# Patient Record
Sex: Female | Born: 1990 | Race: Black or African American | Hispanic: No | Marital: Single | Smoking: Former smoker
Health system: Southern US, Community
[De-identification: ages and names within clinical notes are randomized; demographics above are authoritative.]

## PROBLEM LIST (undated history)

## (undated) ENCOUNTER — Inpatient Hospital Stay (HOSPITAL_COMMUNITY): Payer: Self-pay

## (undated) DIAGNOSIS — E669 Obesity, unspecified: Secondary | ICD-10-CM

## (undated) DIAGNOSIS — T7840XA Allergy, unspecified, initial encounter: Secondary | ICD-10-CM

## (undated) DIAGNOSIS — I10 Essential (primary) hypertension: Secondary | ICD-10-CM

## (undated) DIAGNOSIS — D649 Anemia, unspecified: Secondary | ICD-10-CM

## (undated) HISTORY — DX: Obesity, unspecified: E66.9

## (undated) HISTORY — PX: INDUCED ABORTION: SHX677

## (undated) HISTORY — PX: WISDOM TOOTH EXTRACTION: SHX21

## (undated) HISTORY — DX: Allergy, unspecified, initial encounter: T78.40XA

## (undated) HISTORY — DX: Anemia, unspecified: D64.9

---

## 2011-11-06 ENCOUNTER — Encounter (HOSPITAL_COMMUNITY): Payer: Self-pay | Admitting: *Deleted

## 2011-11-06 ENCOUNTER — Emergency Department (HOSPITAL_COMMUNITY)
Admission: EM | Admit: 2011-11-06 | Discharge: 2011-11-06 | Disposition: A | Discharge status: Home / Self Care | Payer: BC Managed Care – PPO | Attending: Emergency Medicine | Admitting: Emergency Medicine

## 2011-11-06 DIAGNOSIS — R209 Unspecified disturbances of skin sensation: Secondary | ICD-10-CM | POA: Insufficient documentation

## 2011-11-06 DIAGNOSIS — E860 Dehydration: Secondary | ICD-10-CM | POA: Insufficient documentation

## 2011-11-06 DIAGNOSIS — M79609 Pain in unspecified limb: Secondary | ICD-10-CM | POA: Insufficient documentation

## 2011-11-06 LAB — URINALYSIS, ROUTINE W REFLEX MICROSCOPIC
Bilirubin Urine: NEGATIVE
Hgb urine dipstick: NEGATIVE
Specific Gravity, Urine: 1.009 (ref 1.005–1.030)
pH: 6.5 (ref 5.0–8.0)

## 2011-11-06 LAB — BASIC METABOLIC PANEL
CO2: 24 mEq/L (ref 19–32)
Chloride: 99 mEq/L (ref 96–112)
GFR calc non Af Amer: >90 mL/min (ref >90)
Glucose, Bld: 112 mg/dL — ABNORMAL HIGH (ref 70–99)
Potassium: 3.5 mEq/L (ref 3.5–5.1)
Sodium: 135 mEq/L (ref 135–145)

## 2011-11-06 MED ORDER — SODIUM CHLORIDE 0.9 % IV BOLUS (SEPSIS)
2000.0000 mL | Freq: Once | INTRAVENOUS | Status: AC
  Administered 2011-11-06: 2000 mL via INTRAVENOUS

## 2011-11-06 MED ORDER — KETOROLAC TROMETHAMINE 30 MG/ML IJ SOLN: 30.0000 mg | Freq: Once | INTRAMUSCULAR | Status: DC

## 2011-11-06 NOTE — ED Notes (Signed)
Pt. Ate 100% of dinner.  Pt. Is feeling better

## 2011-11-06 NOTE — ED Notes (Signed)
Spoke with Iv nurse she will be down to insert heplock

## 2011-11-06 NOTE — ED Notes (Signed)
Pt woke up Saturday with face and leg tightness.  Pt states she drank a lot of water and then states her heart or breathing felt weird.  Her hands and legs have been tingling and reports hands twitching

## 2011-11-06 NOTE — ED Provider Notes (Signed)
Medical screening examination/treatment/procedure(s) were performed by non-physician practitioner and as supervising physician I was immediately available for consultation/collaboration.  Stephen Turnbaugh, MD 11/06/11 1712 

## 2011-11-06 NOTE — ED Notes (Signed)
Family at bedside. 

## 2011-11-06 NOTE — ED Notes (Signed)
Pt states that she went to a party on Friday and woke up Saturday morning and had numbness and tingling in arms and hands. sts also numbness and tightness in face. sts she did have a few drinks. Pt anxious and hypertensive. No hx of same.

## 2011-11-06 NOTE — ED Provider Notes (Signed)
History     CSN: [id number]  Arrival date & time 11/06/11  1029   First MD Initiated Contact with Patient 11/06/11 1115      No chief complaint on file.   (Consider location/radiation/quality/duration/timing/severity/associated sxs/prior treatment) HPI Patient presents to the emergency department with facial tightness and extremity tingling.  Patient, states she was out drinking liquor on Saturday night, and she woke up Sunday morning, with tingling in her extremities and facial tightness.  Patient denies chest pain, shortness of breath, nausea, vomiting, weakness, numbness, headache, blurred vision, dysuria, abdominal pain, or fever.  Patient said she did not take anything prior to arrival, for her symptoms History reviewed. No pertinent past medical history.  Past Surgical History  Procedure Date  . Wisdom tooth extraction     No family history on file.  History  Substance Use Topics  . Smoking status: Current Every Day Smoker  . Smokeless tobacco: Not on file  . Alcohol Use: Yes     occ    OB History    Grav Para Term Preterm Abortions TAB SAB Ect Mult Living                  Review of Systems All other systems negative except as documented in the HPI. All pertinent positives and negatives as reviewed in the HPI.  Allergies  Penicillins  Home Medications  No current outpatient prescriptions on file.  BP 149/84  Pulse 92  Temp 98.9 F (37.2 C) (Oral)  Resp 18  SpO2 100%  LMP 10/10/2011  Physical Exam  Nursing note and vitals reviewed. Constitutional: She is oriented to person, place, and time. She appears well-developed and well-nourished. No distress.  HENT:  Head: Normocephalic and atraumatic.  Mouth/Throat: Oropharynx is clear and moist.  Eyes: Pupils are equal, round, and reactive to light.  Cardiovascular: Normal rate, regular rhythm and normal heart sounds.   Pulmonary/Chest: Effort normal and breath sounds normal.  Neurological: She is alert  and oriented to person, place, and time.  Skin: Skin is warm and dry. No rash noted.    ED Course  Procedures (including critical care time)  Labs Reviewed  BASIC METABOLIC PANEL - Abnormal; Notable for the following:    Glucose, Bld 112 (*)     All other components within normal limits  URINALYSIS, ROUTINE W REFLEX MICROSCOPIC  PREGNANCY, URINE   Patient most likely has residual effects of large amounts of alcohol.  Patient has no neurological deficits on exam.  She is able ambulate without difficulty.  Patient has normal strength in all 4 extremities.  Patient was sent to the CDU in stable condition.  MDM   MDM Reviewed: nursing note and vitals Interpretation: labs           Carlyle Dolly, PA-C 11/06/11 1341

## 2011-11-06 NOTE — ED Provider Notes (Signed)
12:35 PM Assumed care of patient in the CDU from [facility], PA-C.  Patient presents today with a chief complaint of numbness of her face bilaterally.  She has had these symptoms over the past 3 days.  She drank a large amount of alcohol the night prior to having these symptoms.  Plan is for patient to have a couple liters of IVF and discharge home.  BMP unremarkable.  Urine pregnancy negative.  UA normal.     2:05 PM IV team here to place the IV.  Patient reports that the tightness in her face has improved.  Patient alert and orientated x 3 Heart:  RRR Lungs:  CTAB  Neuro:  CN II-XII grossly intact  4:00 PM Patient signed out to [facility], PA-C.  Patient is currently getting her second liter of fluid.   Pascal Lux [place], PA-C 11/06/11 1709

## 2011-11-06 NOTE — ED Provider Notes (Signed)
Medical screening examination/treatment/procedure(s) were performed by non-physician practitioner and as supervising physician I was immediately available for consultation/collaboration.  Hodge Stachnik, MD 11/06/11 1712 

## 2016-01-31 NOTE — L&D Delivery Note (Signed)
Operative Delivery Note At 1:41 AM a viable female was delivered via Vaginal, Vacuum Investment banker, operational(Extractor).  Presentation: vertex; Position: Occiput,, Anterior; Station: +4.    Verbal consent: obtained from patient.  Risks and benefits discussed in detail.  Risks include, but are not limited to the risks of anesthesia, bleeding, infection, damage to maternal tissues, fetal cephalhematoma.  There is also the risk of inability to effect vaginal delivery of the head, or shoulder dystocia that cannot be resolved by established maneuvers, leading to the need for emergency cesarean section.  APGAR: 7, 9; weight  pending Placenta status: to L&D Cord:  with the following complications:none  Anesthesia:  epidural Instruments: Kiwi Episiotomy: None Lacerations: 2nd degree;Perineal;Periurethral;Labial Suture Repair: 2.0 3.0 vicryl Est. Blood Loss (mL): 350  Mom to postpartum.  Baby to Couplet care / Skin to Skin.  Sarah BevelsJennifer M Angeleigh Brewer 01/18/2017, 2:06 AM

## 2016-08-31 LAB — OB RESULTS CONSOLE ABO/RH: RH Type: POSITIVE

## 2016-08-31 LAB — OB RESULTS CONSOLE GC/CHLAMYDIA
Chlamydia: NEGATIVE
Gonorrhea: NEGATIVE

## 2016-08-31 LAB — OB RESULTS CONSOLE HEPATITIS B SURFACE ANTIGEN: Hepatitis B Surface Ag: NEGATIVE

## 2016-08-31 LAB — OB RESULTS CONSOLE RUBELLA ANTIBODY, IGM: Rubella: IMMUNE

## 2016-08-31 LAB — OB RESULTS CONSOLE HIV ANTIBODY (ROUTINE TESTING): HIV: NONREACTIVE

## 2016-08-31 LAB — OB RESULTS CONSOLE RPR: RPR: NONREACTIVE

## 2016-08-31 LAB — OB RESULTS CONSOLE ANTIBODY SCREEN: ANTIBODY SCREEN: NEGATIVE

## 2016-09-15 ENCOUNTER — Other Ambulatory Visit (HOSPITAL_COMMUNITY): Payer: Self-pay | Admitting: Obstetrics & Gynecology

## 2016-09-15 DIAGNOSIS — Z3689 Encounter for other specified antenatal screening: Secondary | ICD-10-CM

## 2016-09-26 ENCOUNTER — Encounter (HOSPITAL_COMMUNITY): Payer: Self-pay | Admitting: *Deleted

## 2016-09-27 ENCOUNTER — Other Ambulatory Visit (HOSPITAL_COMMUNITY): Payer: Self-pay | Admitting: Obstetrics & Gynecology

## 2016-09-27 ENCOUNTER — Encounter (HOSPITAL_COMMUNITY): Payer: Self-pay

## 2016-09-27 ENCOUNTER — Ambulatory Visit (HOSPITAL_COMMUNITY)
Admission: RE | Admit: 2016-09-27 | Discharge: 2016-09-27 | Disposition: A | Discharge status: Home / Self Care | Payer: Medicaid Other | Source: Ambulatory Visit | Attending: Obstetrics & Gynecology | Admitting: Obstetrics & Gynecology

## 2016-09-27 DIAGNOSIS — Z363 Encounter for antenatal screening for malformations: Secondary | ICD-10-CM

## 2016-09-27 DIAGNOSIS — Z3A21 21 weeks gestation of pregnancy: Secondary | ICD-10-CM

## 2016-09-27 DIAGNOSIS — E669 Obesity, unspecified: Secondary | ICD-10-CM | POA: Insufficient documentation

## 2016-09-27 DIAGNOSIS — O10919 Unspecified pre-existing hypertension complicating pregnancy, unspecified trimester: Secondary | ICD-10-CM

## 2016-09-27 DIAGNOSIS — O99212 Obesity complicating pregnancy, second trimester: Secondary | ICD-10-CM

## 2016-09-27 DIAGNOSIS — O26899 Other specified pregnancy related conditions, unspecified trimester: Secondary | ICD-10-CM | POA: Insufficient documentation

## 2016-09-27 DIAGNOSIS — Z3689 Encounter for other specified antenatal screening: Secondary | ICD-10-CM

## 2016-09-27 HISTORY — DX: Essential (primary) hypertension: I10

## 2016-09-27 NOTE — Addendum Note (Signed)
Encounter addended by: Vivien Rota, RT on: 09/27/2016  3:49 PM<BR>    Actions taken: Imaging Exam ended

## 2016-09-28 ENCOUNTER — Other Ambulatory Visit (HOSPITAL_COMMUNITY): Payer: Self-pay | Admitting: *Deleted

## 2016-09-28 DIAGNOSIS — O10919 Unspecified pre-existing hypertension complicating pregnancy, unspecified trimester: Secondary | ICD-10-CM

## 2016-10-26 ENCOUNTER — Other Ambulatory Visit (HOSPITAL_COMMUNITY): Payer: Self-pay | Admitting: Obstetrics and Gynecology

## 2016-10-26 ENCOUNTER — Inpatient Hospital Stay (HOSPITAL_COMMUNITY)
Admission: AD | Admit: 2016-10-26 | Discharge: 2016-10-26 | Disposition: A | Discharge status: Home / Self Care | Payer: BC Managed Care – PPO | Source: Ambulatory Visit | Attending: Obstetrics & Gynecology | Admitting: Obstetrics & Gynecology

## 2016-10-26 ENCOUNTER — Ambulatory Visit (HOSPITAL_COMMUNITY)
Admission: RE | Admit: 2016-10-26 | Discharge: 2016-10-26 | Disposition: A | Discharge status: Home / Self Care | Payer: BC Managed Care – PPO | Source: Ambulatory Visit | Attending: Obstetrics & Gynecology | Admitting: Obstetrics & Gynecology

## 2016-10-26 ENCOUNTER — Encounter (HOSPITAL_COMMUNITY): Payer: Self-pay

## 2016-10-26 DIAGNOSIS — Z880 Allergy status to penicillin: Secondary | ICD-10-CM | POA: Insufficient documentation

## 2016-10-26 DIAGNOSIS — O10012 Pre-existing essential hypertension complicating pregnancy, second trimester: Secondary | ICD-10-CM

## 2016-10-26 DIAGNOSIS — O99212 Obesity complicating pregnancy, second trimester: Secondary | ICD-10-CM | POA: Insufficient documentation

## 2016-10-26 DIAGNOSIS — Z3A26 26 weeks gestation of pregnancy: Secondary | ICD-10-CM

## 2016-10-26 DIAGNOSIS — O10912 Unspecified pre-existing hypertension complicating pregnancy, second trimester: Secondary | ICD-10-CM | POA: Diagnosis not present

## 2016-10-26 DIAGNOSIS — Z87891 Personal history of nicotine dependence: Secondary | ICD-10-CM | POA: Diagnosis not present

## 2016-10-26 DIAGNOSIS — O10919 Unspecified pre-existing hypertension complicating pregnancy, unspecified trimester: Secondary | ICD-10-CM

## 2016-10-26 DIAGNOSIS — Z362 Encounter for other antenatal screening follow-up: Secondary | ICD-10-CM | POA: Insufficient documentation

## 2016-10-26 LAB — COMPREHENSIVE METABOLIC PANEL
ALK PHOS: 85 U/L (ref 38–126)
ALT: 13 U/L — ABNORMAL LOW (ref 14–54)
ANION GAP: 8 (ref 5–15)
AST: 17 U/L (ref 15–41)
Albumin: 3.2 g/dL — ABNORMAL LOW (ref 3.5–5.0)
BILIRUBIN TOTAL: 0.9 mg/dL (ref 0.3–1.2)
BUN: 7 mg/dL (ref 6–20)
CALCIUM: 9.1 mg/dL (ref 8.9–10.3)
CO2: 24 mmol/L (ref 22–32)
Chloride: 103 mmol/L (ref 101–111)
Creatinine, Ser: 0.55 mg/dL (ref 0.44–1.00)
Glucose, Bld: 87 mg/dL (ref 65–99)
POTASSIUM: 3.7 mmol/L (ref 3.5–5.1)
Sodium: 135 mmol/L (ref 135–145)
TOTAL PROTEIN: 7.3 g/dL (ref 6.5–8.1)

## 2016-10-26 LAB — CBC
HEMATOCRIT: 31.3 % — AB (ref 36.0–46.0)
HEMOGLOBIN: 10.8 g/dL — AB (ref 12.0–15.0)
MCH: 31.5 pg (ref 26.0–34.0)
MCHC: 34.5 g/dL (ref 30.0–36.0)
MCV: 91.3 fL (ref 78.0–100.0)
Platelets: 226 10*3/uL (ref 150–400)
RBC: 3.43 MIL/uL — ABNORMAL LOW (ref 3.87–5.11)
RDW: 14.5 % (ref 11.5–15.5)
WBC: 9.9 10*3/uL (ref 4.0–10.5)

## 2016-10-26 LAB — PROTEIN / CREATININE RATIO, URINE
CREATININE, URINE: 128 mg/dL
PROTEIN CREATININE RATIO: 0.09 mg/mg{creat} (ref 0.00–0.15)
Total Protein, Urine: 12 mg/dL

## 2016-10-26 MED ORDER — LABETALOL HCL 100 MG PO TABS: 200.0000 mg | ORAL_TABLET | Freq: Two times a day (BID) | ORAL | 0 refills | Status: DC

## 2016-10-26 NOTE — MAU Note (Signed)
Pt sent from MFM with elevated b/p's today

## 2016-10-26 NOTE — MAU Provider Note (Signed)
Chief Complaint:  Hypertension   First Provider Initiated Contact with Patient 10/26/16 1546     HPI: Sarah Brewer is a 26 y.o. G2P0010 at 48w0dwho presents to maternity admissions reporting hypertension noted in MFM today.  Does have a recorded history or chronic hypertension.. She reports good fetal movement, denies LOF, vaginal bleeding, vaginal itching/burning, urinary symptoms, h/a, dizziness, n/v, diarrhea, constipation or fever/chills.  She denies headache, visual changes or RUQ abdominal pain.  Hypertension  This is a recurrent problem. Associated symptoms include peripheral edema. Pertinent negatives include no anxiety, blurred vision, headaches, malaise/fatigue or palpitations. There are no associated agents to hypertension. There are no known risk factors for coronary artery disease. Past treatments include nothing. There are no compliance problems.    RN Note: Pt sent from MFM with elevated b/p's today  Past Medical History: Past Medical History:  Diagnosis Date  . Hypertension     Past obstetric history: OB History  Gravida Para Term Preterm AB Living  2       1 0  SAB TAB Ectopic Multiple Live Births    1          # Outcome Date GA Lbr Len/2nd Weight Sex Delivery Anes PTL Lv  2 Current           1 TAB               Past Surgical History: Past Surgical History:  Procedure Laterality Date  . WISDOM TOOTH EXTRACTION      Family History: No family history on file.  Social History: Social History  Substance Use Topics  . Smoking status: Former Games developer  . Smokeless tobacco: Never Used  . Alcohol use Yes     Comment: occ    Allergies:  Allergies  Allergen Reactions  . Penicillins Rash    Meds:  Prescriptions Prior to Admission  Medication Sig Dispense Refill Last Dose  . LABETALOL HCL PO Take 100 mg by mouth 2 (two) times daily.    10/26/2016 at 1200  . Prenatal Vit-Fe Fumarate-FA (PRENATAL VITAMIN PO) Take by mouth.   Taking    I have reviewed  patient's Past Medical Hx, Surgical Hx, Family Hx, Social Hx, medications and allergies.   ROS:  Review of Systems  Constitutional: Negative for malaise/fatigue.  Eyes: Negative for blurred vision.  Cardiovascular: Negative for palpitations.  Neurological: Negative for headaches.   Other systems negative  Physical Exam  No data found.  Vitals:   10/26/16 1600 10/26/16 1615 10/26/16 1630 10/26/16 1650  BP: (!) 143/95 (!) 141/86 (!) 144/87 (!) 151/90  Pulse: 88 80 83 80  Resp:    18  Temp:    98.3 F (36.8 C)  TempSrc:    Oral  SpO2:    100%    Constitutional: Well-developed, well-nourished female in no acute distress.  Cardiovascular: normal rate and rhythm Respiratory: normal effort, clear to auscultation bilaterally GI: Abd soft, non-tender, gravid appropriate for gestational age.   No rebound or guarding. MS: Extremities nontender, no edema, normal ROM Neurologic: Alert and oriented x 4.  GU: Neg CVAT.  PELVIC EXAM:  deferred  FHT:  Baseline 140 , moderate variability, small accelerations present, no decelerations Contractions:  Rare   Labs: No results found for this or any previous visit (from the past 24 hour(s)).    Imaging:  [place] Mfm Ob Detail +14 Wk  Result Date: 09/27/2016 ----------------------------------------------------------------------  OBSTETRICS REPORT                      (  Signed Final 09/27/2016 03:41 pm) ---------------------------------------------------------------------- Patient Info  ID #:       [id number]                         D.O.B.:   05-11-1990 (25 yrs)  Name:       Sarah Brewer                     Visit Date:  09/27/2016 02:20 pm ---------------------------------------------------------------------- Performed By  Performed By:     Vivien Rota        Ref. Address:     [address] [place]                    [facility]                                                             [place]                                                             [address]                                                              [place], [place]                                                             [address]  Attending:        Charlsie Merles MD         Location:         [facility]  Referred By:      Sharon Seller MD ---------------------------------------------------------------------- Orders   #  Description                                 Code   1  [place] MFM OB DETAIL +14 WK                     76811.01  ----------------------------------------------------------------------   #  Ordered By               Order #        Accession #    Episode #   1  Myna Hidalgo            [id number]       [id number]     [id number]  ---------------------------------------------------------------------- Indications   [redacted] weeks gestation of pregnancy  Z3A.21   Encounter for antenatal screening for          Z36.3   malformations   Obesity complicating pregnancy, second         O99.212   trimester   Hypertension - Chronic/Pre-existing            O10.019  ---------------------------------------------------------------------- OB History  Blood Type:            Height:  5'9"   Weight (lb):  245      BMI:   36.18  Gravidity:    2  TOP:          1 ---------------------------------------------------------------------- Fetal Evaluation  Num Of Fetuses:     1  Fetal Heart         158  Rate(bpm):  Cardiac Activity:   Observed  Presentation:       Cephalic  Placenta:           Anterior, above cervical os  P. Cord Insertion:  Visualized, central  Amniotic Fluid  AFI FV:      Subjectively within normal limits                              Largest Pocket(cm)                              5.89 ---------------------------------------------------------------------- Biometry  BPD:      49.8  mm     G. Age:  21w 0d         19  %    CI:        68.64   %   70 - 86                                                          FL/HC:      20.5   %   18.4 - 20.2  HC:      192.1  mm     G. Age:  21w 4d          23  %    HC/AC:      1.07       1.06 - 1.25  AC:      178.7  mm     G. Age:  22w 5d         71  %    FL/BPD:     78.9   %   71 - 87  FL:       39.3  mm     G. Age:  22w 4d         66  %    FL/AC:      22.0   %   20 - 24  HUM:      35.8  mm     G. Age:  22w 3d         62  %  Est. FW:     510  gm      1 lb 2 oz     57  % ---------------------------------------------------------------------- Gestational Age  LMP:           21w 6d       Date:  04/27/16                 EDD:   02/01/17  U/S Today:     22w 0d                                        EDD:   01/31/17  Best:          21w 6d    Det. By:   LMP  (04/27/16)          EDD:   02/01/17 ---------------------------------------------------------------------- Anatomy  Cranium:               Appears normal         Aortic Arch:            Appears normal  Cavum:                 Not well visualized    Ductal Arch:            Appears normal  Ventricles:            Appears normal         Diaphragm:              Appears normal  Choroid Plexus:        Appears normal         Stomach:                Appears normal, left                                                                        sided  Cerebellum:            Appears normal         Abdomen:                Appears normal  Posterior Fossa:       Appears normal         Abdominal Wall:         Appears nml (cord                                                                        insert, abd wall)  Nuchal Fold:           Not applicable (>20    Cord Vessels:           Appears normal ([redacted]                         wks GA)                                        vessel cord)  Face:  Not well visualized    Kidneys:                Appear normal  Lips:                  Not well visualized    Bladder:                Appears normal  Thoracic:              Appears normal         Spine:                  Appears normal  Heart:                 Echogenic focus        Upper Extremities:      Appears normal                          in LV  RVOT:                  Appears normal         Lower Extremities:      Appears normal  LVOT:                  Appears normal  Other:  Fetus appears to be a female. Heels visualized. Technically difficult          due to maternal habitus and fetal position. ---------------------------------------------------------------------- Cervix Uterus Adnexa  Cervix  Length:           3.07  cm.  Normal appearance by transabdominal scan.  Uterus  No abnormality visualized.  Left Ovary  Not visualized.  Right Ovary  Within normal limits.  Adnexa:       No abnormality visualized. No adnexal mass                visualized. ---------------------------------------------------------------------- Impression  Singleton intrauterine pregnancy at 21+6 weeks with CHTN.  Here for anatomic survey  Review of the anatomy shows no sonographic markers for  aneuploidy or structural anomalies  However, facialevaluations should be considered suboptimal  secondary to fetal position  There is a nonpathologic echogenic focus noted in the left  ventricle  Amniotic fluid volume is normal  Estimated fetal weight is 510g which is growth in the 57th  percentile ---------------------------------------------------------------------- Recommendations  Repeat scan for growth in 4 weeks ----------------------------------------------------------------------                 Charlsie Merles, MD Electronically Signed Final Report   09/27/2016 03:41 pm ----------------------------------------------------------------------   MAU Course/MDM: I have ordered labs and reviewed results. Labs are normal  NST reviewed and reassuring for gestational age Consult Dr Charlotta Newton with presentation, exam findings and test results.  Treatments in MAU included EFM.    Assessment: Single IUP at [redacted]w[redacted]d Chronic hypertension in pregnancy  Plan: Discharge home Will increase her dose of Labetalol from  bid to  bid Preeclampsia precautions Preterm Labor precautions  and fetal kick counts Follow up in Office for prenatal visits and recheck of BP  Encouraged to return here or to other Urgent Care/ED if she develops worsening of symptoms, increase in pain, fever, or other concerning symptoms.   Pt stable at time of discharge.  Wynelle Bourgeois CNM, MSN Certified Nurse-Midwife 10/26/2016 3:47 PM

## 2016-10-26 NOTE — Progress Notes (Signed)
D/c instructions discussed, pt aware of prescription change.  Denies ques/concerns. D/c home in stable condition.

## 2016-10-26 NOTE — ED Notes (Signed)
Report called to L. Vickki Muff, Charity fundraiser.  Pt to MAU for BP evaluation.

## 2016-10-26 NOTE — Discharge Instructions (Signed)
Hypertension During Pregnancy °Hypertension, commonly called high blood pressure, is when the force of blood pumping through your arteries is too strong. Arteries are blood vessels that carry blood from the heart throughout the body. Hypertension during pregnancy can cause problems for you and your baby. Your baby may be born early (prematurely) or may not weigh as much as he or she should at birth. Very bad cases of hypertension during pregnancy can be life-threatening. °Different types of hypertension can occur during pregnancy. These include: °· Chronic hypertension. This happens when: °? You have hypertension before pregnancy and it continues during pregnancy. °? You develop hypertension before you are [redacted] weeks pregnant, and it continues during pregnancy. °· Gestational hypertension. This is hypertension that develops after the 20th week of pregnancy. °· Preeclampsia, also called toxemia of pregnancy. This is a very serious type of hypertension that develops only during pregnancy. It affects the whole body, and it can be very dangerous for you and your baby. ° °Gestational hypertension and preeclampsia usually go away within 6 weeks after your baby is born. Women who have hypertension during pregnancy have a greater chance of developing hypertension later in life or during future pregnancies. °What are the causes? °The exact cause of hypertension is not known. °What increases the risk? °There are certain factors that make it more likely for you to develop hypertension during pregnancy. These include: °· Having hypertension during a previous pregnancy or prior to pregnancy. °· Being overweight. °· Being older than age 40. °· Being pregnant for the first time or being pregnant with more than one baby. °· Becoming pregnant using fertilization methods such as IVF (in vitro fertilization). °· Having diabetes, kidney problems, or systemic lupus erythematosus. °· Having a family history of hypertension. ° °What are the  signs or symptoms? °Chronic hypertension and gestational hypertension rarely cause symptoms. Preeclampsia causes symptoms, which may include: °· Increased protein in your urine. Your health care provider will check for this at every visit before you give birth (prenatal visit). °· Severe headaches. °· Sudden weight gain. °· Swelling of the hands, face, legs, and feet. °· Nausea and vomiting. °· Vision problems, such as blurred or double vision. °· Numbness in the face, arms, legs, and feet. °· Dizziness. °· Slurred speech. °· Sensitivity to bright lights. °· Abdominal pain. °· Convulsions. ° °How is this diagnosed? °You may be diagnosed with hypertension during a routine prenatal exam. At each prenatal visit, you may: °· Have a urine test to check for high amounts of protein in your urine. °· Have your blood pressure checked. A blood pressure reading is recorded as two numbers, such as "120 over 80" (or 120/80). The first ("top") number is called the systolic pressure. It is a measure of the pressure in your arteries when your heart beats. The second ("bottom") number is called the diastolic pressure. It is a measure of the pressure in your arteries as your heart relaxes between beats. Blood pressure is measured in a unit called mm Hg. A normal blood pressure reading is: °? Systolic: below 120. °? Diastolic: below 80. ° °The type of hypertension that you are diagnosed with depends on your test results and when your symptoms developed. °· Chronic hypertension is usually diagnosed before 20 weeks of pregnancy. °· Gestational hypertension is usually diagnosed after 20 weeks of pregnancy. °· Hypertension with high amounts of protein in the urine is diagnosed as preeclampsia. °· Blood pressure measurements that stay above 160 systolic, or above 110 diastolic, are   signs of severe preeclampsia. ° °How is this treated? °Treatment for hypertension during pregnancy varies depending on the type of hypertension you have and how  serious it is. °· If you take medicines called ACE inhibitors to treat chronic hypertension, you may need to switch medicines. ACE inhibitors should not be taken during pregnancy. °· If you have gestational hypertension, you may need to take blood pressure medicine. °· If you are at risk for preeclampsia, your health care provider may recommend that you take a low-dose aspirin every day to prevent high blood pressure during your pregnancy. °· If you have severe preeclampsia, you may need to be hospitalized so you and your baby can be monitored closely. You may also need to take medicine (magnesium sulfate) to prevent seizures and to lower blood pressure. This medicine may be given as an injection or through an IV tube. °· In some cases, if your condition gets worse, you may need to deliver your baby early. ° °Follow these instructions at home: °Eating and drinking °· Drink enough fluid to keep your urine clear or pale yellow. °· Eat a healthy diet that is low in salt (sodium). Do not add salt to your food. Check food labels to see how much sodium a food or beverage contains. °Lifestyle °· Do not use any products that contain nicotine or tobacco, such as cigarettes and e-cigarettes. If you need help quitting, ask your health care provider. °· Do not use alcohol. °· Avoid caffeine. °· Avoid stress as much as possible. Rest and get plenty of sleep. °General instructions °· Take over-the-counter and prescription medicines only as told by your health care provider. °· While lying down, lie on your left side. This keeps pressure off your baby. °· While sitting or lying down, raise (elevate) your feet. Try putting some pillows under your lower legs. °· Exercise regularly. Ask your health care provider what kinds of exercise are best for you. °· Keep all prenatal and follow-up visits as told by your health care provider. This is important. °Contact a health care provider if: °· You have symptoms that your health care  provider told you may require more treatment or monitoring, such as: °? Fever. °? Vomiting. °? Headache. °Get help right away if: °· You have severe abdominal pain or vomiting that does not get better with treatment. °· You suddenly develop swelling in your hands, ankles, or face. °· You gain 4 lbs (1.8 kg) or more in 1 week. °· You develop vaginal bleeding, or you have blood in your urine. °· You do not feel your baby moving as much as usual. °· You have blurred or double vision. °· You have muscle twitching or sudden tightening (spasms). °· You have shortness of breath. °· Your lips or fingernails turn blue. °This information is not intended to replace advice given to you by your health care provider. Make sure you discuss any questions you have with your health care provider. °Document Released: 10/04/2010 Document Revised: 08/06/2015 Document Reviewed: 07/02/2015 °Elsevier Interactive Patient Education © 2018 [employer] ° °

## 2016-12-01 LAB — OB RESULTS CONSOLE GBS: GBS: NEGATIVE

## 2016-12-05 ENCOUNTER — Other Ambulatory Visit (HOSPITAL_COMMUNITY): Payer: Self-pay | Admitting: Obstetrics & Gynecology

## 2016-12-05 ENCOUNTER — Ambulatory Visit (HOSPITAL_COMMUNITY)
Admission: RE | Admit: 2016-12-05 | Discharge: 2016-12-05 | Disposition: A | Discharge status: Home / Self Care | Payer: BC Managed Care – PPO | Source: Ambulatory Visit | Attending: Obstetrics & Gynecology | Admitting: Obstetrics & Gynecology

## 2016-12-05 DIAGNOSIS — O99213 Obesity complicating pregnancy, third trimester: Secondary | ICD-10-CM | POA: Insufficient documentation

## 2016-12-05 DIAGNOSIS — O10919 Unspecified pre-existing hypertension complicating pregnancy, unspecified trimester: Secondary | ICD-10-CM

## 2016-12-05 DIAGNOSIS — O288 Other abnormal findings on antenatal screening of mother: Secondary | ICD-10-CM

## 2016-12-05 DIAGNOSIS — O289 Unspecified abnormal findings on antenatal screening of mother: Secondary | ICD-10-CM | POA: Insufficient documentation

## 2016-12-05 DIAGNOSIS — O10013 Pre-existing essential hypertension complicating pregnancy, third trimester: Secondary | ICD-10-CM | POA: Diagnosis not present

## 2016-12-05 DIAGNOSIS — Z3A31 31 weeks gestation of pregnancy: Secondary | ICD-10-CM | POA: Insufficient documentation

## 2016-12-05 DIAGNOSIS — O99212 Obesity complicating pregnancy, second trimester: Secondary | ICD-10-CM | POA: Diagnosis present

## 2017-01-05 ENCOUNTER — Telehealth (HOSPITAL_COMMUNITY): Payer: Self-pay | Admitting: *Deleted

## 2017-01-05 ENCOUNTER — Encounter (HOSPITAL_COMMUNITY): Payer: Self-pay | Admitting: *Deleted

## 2017-01-05 NOTE — Telephone Encounter (Signed)
Preadmission screen  

## 2017-01-15 NOTE — H&P (Signed)
HPI: 26 y/o G2P0010 @ 4269w6d estimated gestational age (as dated by LMP c/w 20 week ultrasound) presents for IOL due to chronic HTN.   no Leaking of Fluid,   no Vaginal Bleeding,   no Uterine Contractions,  + Fetal Movement.  Prenatal care has been provided by Dr. Charlotta Newtonzan  ROS: no HA, no epigastric pain, no visual changes.    Pregnancy complicated by: 1) chronic HTN- currently on Procardia XL 30mg  daily -NST weekly have been reactive -growth q 4wks, last US 12/13- vertex, EFW 6#12oz (66%) -baseline labs wnl   Prenatal Transfer Tool  Maternal Diabetes: No Genetic Screening: Normal Maternal Ultrasounds/Referrals: Normal Fetal Ultrasounds or other Referrals:  None Maternal Substance Abuse:  No Significant Maternal Medications:  Meds include: Other:  Procardia XL 30mg  daily Significant Maternal Lab Results: Lab values include: Group B Strep negative   PNL:  GBS neg, Rub Immune, Hep B neg, RPR NR, HIV neg, GC/C neg, glucola:99 Hgb: 11.1 Blood type: O positive  Immunizations: Tdap: 11/13 Flu: declined  OBHx: SAB x 1 PMHx:  Chronic HTN Meds:  PNV, Procardia XL 30mg  daily Allergy:   Allergies  Allergen Reactions  . Penicillins Rash    Has patient had a PCN reaction causing immediate rash, facial/tongue/throat swelling, SOB or lightheadedness with hypotension: Yes Has patient had a PCN reaction causing severe rash involving mucus membranes or skin necrosis: Yes Has patient had a PCN reaction that required hospitalization: No Has patient had a PCN reaction occurring within the last 10 years: No If all of the above answers are "NO", then may proceed with Cephalosporin use.   SurgHx: none SocHx:   no Tobacco, no  EtOH, no Illicit Drugs  O: LMP 04/27/2016 (Approximate)  Gen. AAOx3, NAD CV.  RRR  No murmur.  Resp. CTAB, no wheeze or crackles. Abd. Gravid,  no tenderness,  no rigidity,  no guarding Extr.  no edema B/L , no calf tenderness, neg Homan's B/L FHT: Reactive NST  (12/17) Toco:  SVE: closed/soft/-3   Labs: see orders  A/P:  26 y.o. G2P0010 @ 7269w6d EGA who presents for IOL for chronic HTN -FWB:  Reassuring by doppler -IOL: plan for cytotec per protocol -GBS: neg -chronic HTN: continue home BP medication, baseline labs to be obtained on arrival  West MayfieldJennifer Rod Majerus, [place] [phone] (pager) [phone] (office)

## 2017-01-15 NOTE — H&P (Deleted)
  The note originally documented on this encounter has been moved the the encounter in which it belongs.  

## 2017-01-17 ENCOUNTER — Inpatient Hospital Stay (HOSPITAL_COMMUNITY): Payer: BC Managed Care – PPO | Admitting: Anesthesiology

## 2017-01-17 ENCOUNTER — Encounter (HOSPITAL_COMMUNITY): Payer: Self-pay

## 2017-01-17 ENCOUNTER — Inpatient Hospital Stay (HOSPITAL_COMMUNITY)
Admission: RE | Admit: 2017-01-17 | Discharge: 2017-01-20 | DRG: 807 | Disposition: A | Discharge status: Home / Self Care | Payer: BC Managed Care – PPO | Source: Ambulatory Visit | Attending: Obstetrics & Gynecology | Admitting: Obstetrics & Gynecology

## 2017-01-17 ENCOUNTER — Inpatient Hospital Stay (HOSPITAL_COMMUNITY)
Admission: AD | Admit: 2017-01-17 | Payer: BC Managed Care – PPO | Source: Ambulatory Visit | Admitting: Obstetrics & Gynecology

## 2017-01-17 DIAGNOSIS — O99214 Obesity complicating childbirth: Secondary | ICD-10-CM | POA: Diagnosis present

## 2017-01-17 DIAGNOSIS — Z87891 Personal history of nicotine dependence: Secondary | ICD-10-CM

## 2017-01-17 DIAGNOSIS — E669 Obesity, unspecified: Secondary | ICD-10-CM | POA: Diagnosis present

## 2017-01-17 DIAGNOSIS — Z880 Allergy status to penicillin: Secondary | ICD-10-CM | POA: Diagnosis not present

## 2017-01-17 DIAGNOSIS — O10919 Unspecified pre-existing hypertension complicating pregnancy, unspecified trimester: Secondary | ICD-10-CM | POA: Diagnosis present

## 2017-01-17 DIAGNOSIS — Z3A37 37 weeks gestation of pregnancy: Secondary | ICD-10-CM

## 2017-01-17 DIAGNOSIS — O1002 Pre-existing essential hypertension complicating childbirth: Secondary | ICD-10-CM | POA: Diagnosis present

## 2017-01-17 LAB — COMPREHENSIVE METABOLIC PANEL
ALBUMIN: 2.8 g/dL — AB (ref 3.5–5.0)
ALT: 16 U/L (ref 14–54)
ANION GAP: 9 (ref 5–15)
AST: 27 U/L (ref 15–41)
Alkaline Phosphatase: 135 U/L — ABNORMAL HIGH (ref 38–126)
BUN: 9 mg/dL (ref 6–20)
CHLORIDE: 105 mmol/L (ref 101–111)
CO2: 19 mmol/L — ABNORMAL LOW (ref 22–32)
Calcium: 8.9 mg/dL (ref 8.9–10.3)
Creatinine, Ser: 0.54 mg/dL (ref 0.44–1.00)
GFR calc Af Amer: >60 mL/min (ref >60)
GFR calc non Af Amer: >60 mL/min (ref >60)
GLUCOSE: 85 mg/dL (ref 65–99)
POTASSIUM: 3.8 mmol/L (ref 3.5–5.1)
Sodium: 133 mmol/L — ABNORMAL LOW (ref 135–145)
TOTAL PROTEIN: 6.6 g/dL (ref 6.5–8.1)
Total Bilirubin: 0.4 mg/dL (ref 0.3–1.2)

## 2017-01-17 LAB — CBC
HEMATOCRIT: 32.9 % — AB (ref 36.0–46.0)
HEMATOCRIT: 37.2 % (ref 36.0–46.0)
Hemoglobin: 11.1 g/dL — ABNORMAL LOW (ref 12.0–15.0)
Hemoglobin: 12.9 g/dL (ref 12.0–15.0)
MCH: 30.7 pg (ref 26.0–34.0)
MCH: 31.4 pg (ref 26.0–34.0)
MCHC: 33.7 g/dL (ref 30.0–36.0)
MCHC: 34.7 g/dL (ref 30.0–36.0)
MCV: 91.1 fL (ref 78.0–100.0)
MCV: 90.5 fL (ref 78.0–100.0)
PLATELETS: 217 10*3/uL (ref 150–400)
Platelets: 226 10*3/uL (ref 150–400)
RBC: 3.61 MIL/uL — AB (ref 3.87–5.11)
RBC: 4.11 MIL/uL (ref 3.87–5.11)
RDW: 14.9 % (ref 11.5–15.5)
RDW: 14.9 % (ref 11.5–15.5)
WBC: 10.5 10*3/uL (ref 4.0–10.5)
WBC: 10.4 10*3/uL (ref 4.0–10.5)

## 2017-01-17 LAB — TYPE AND SCREEN
ABO/RH(D): O POS
ANTIBODY SCREEN: NEGATIVE

## 2017-01-17 LAB — RPR: RPR Ser Ql: NONREACTIVE

## 2017-01-17 LAB — PROTEIN / CREATININE RATIO, URINE
CREATININE, URINE: 55 mg/dL
PROTEIN CREATININE RATIO: 0.18 mg/mg{creat} — AB (ref 0.00–0.15)
Total Protein, Urine: 10 mg/dL

## 2017-01-17 LAB — ABO/RH: ABO/RH(D): O POS

## 2017-01-17 MED ORDER — OXYTOCIN 40 UNITS IN LACTATED RINGERS INFUSION - SIMPLE MED: 2.5000 [IU]/h | INTRAVENOUS | Status: DC

## 2017-01-17 MED ORDER — PHENYLEPHRINE 40 MCG/ML (10ML) SYRINGE FOR IV PUSH (FOR BLOOD PRESSURE SUPPORT)
80.0000 ug | PREFILLED_SYRINGE | INTRAVENOUS | Status: DC | PRN
  Filled 2017-01-17: qty 5
  Filled 2017-01-17: qty 10

## 2017-01-17 MED ORDER — NIFEDIPINE 10 MG PO CAPS
10.0000 mg | ORAL_CAPSULE | ORAL | Status: DC | PRN
  Filled 2017-01-17: qty 2

## 2017-01-17 MED ORDER — LACTATED RINGERS IV SOLN: 500.0000 mL | INTRAVENOUS | Status: DC | PRN

## 2017-01-17 MED ORDER — EPHEDRINE 5 MG/ML INJ
10.0000 mg | INTRAVENOUS | Status: DC | PRN
  Filled 2017-01-17: qty 2

## 2017-01-17 MED ORDER — SOD CITRATE-CITRIC ACID 500-334 MG/5ML PO SOLN: 30.0000 mL | ORAL | Status: DC | PRN

## 2017-01-17 MED ORDER — TERBUTALINE SULFATE 1 MG/ML IJ SOLN
0.2500 mg | Freq: Once | INTRAMUSCULAR | Status: DC | PRN
  Filled 2017-01-17: qty 1

## 2017-01-17 MED ORDER — MISOPROSTOL 25 MCG QUARTER TABLET
25.0000 ug | ORAL_TABLET | ORAL | Status: DC | PRN
  Administered 2017-01-17 (×3): 25 ug via VAGINAL
  Filled 2017-01-17 (×4): qty 1

## 2017-01-17 MED ORDER — DIPHENHYDRAMINE HCL 50 MG/ML IJ SOLN: 12.5000 mg | INTRAMUSCULAR | Status: DC | PRN

## 2017-01-17 MED ORDER — ACETAMINOPHEN 325 MG PO TABS: 650.0000 mg | ORAL_TABLET | ORAL | Status: DC | PRN

## 2017-01-17 MED ORDER — LACTATED RINGERS IV SOLN
INTRAVENOUS | Status: DC
  Administered 2017-01-17 (×4): via INTRAVENOUS

## 2017-01-17 MED ORDER — LACTATED RINGERS IV SOLN
500.0000 mL | Freq: Once | INTRAVENOUS | Status: DC
Start: 2017-01-17 — End: 2017-01-18

## 2017-01-17 MED ORDER — FENTANYL 2.5 MCG/ML BUPIVACAINE 1/10 % EPIDURAL INFUSION (WH - ANES)
14.0000 mL/h | INTRAMUSCULAR | Status: DC | PRN
  Administered 2017-01-17 (×2): 14 mL/h via EPIDURAL
  Filled 2017-01-17 (×2): qty 100

## 2017-01-17 MED ORDER — OXYCODONE-ACETAMINOPHEN 5-325 MG PO TABS: 1.0000 | ORAL_TABLET | ORAL | Status: DC | PRN

## 2017-01-17 MED ORDER — OXYCODONE-ACETAMINOPHEN 5-325 MG PO TABS: 2.0000 | ORAL_TABLET | ORAL | Status: DC | PRN

## 2017-01-17 MED ORDER — OXYTOCIN 40 UNITS IN LACTATED RINGERS INFUSION - SIMPLE MED
1.0000 m[IU]/min | INTRAVENOUS | Status: DC
  Administered 2017-01-17: 2 m[IU]/min via INTRAVENOUS
  Filled 2017-01-17: qty 1000

## 2017-01-17 MED ORDER — PHENYLEPHRINE 40 MCG/ML (10ML) SYRINGE FOR IV PUSH (FOR BLOOD PRESSURE SUPPORT)
80.0000 ug | PREFILLED_SYRINGE | INTRAVENOUS | Status: DC | PRN
  Filled 2017-01-17: qty 5

## 2017-01-17 MED ORDER — LIDOCAINE HCL (PF) 1 % IJ SOLN
INTRAMUSCULAR | Status: DC | PRN
  Administered 2017-01-17 (×2): 5 mL

## 2017-01-17 MED ORDER — LIDOCAINE HCL (PF) 1 % IJ SOLN
30.0000 mL | INTRAMUSCULAR | Status: AC | PRN
  Administered 2017-01-18: 30 mL via SUBCUTANEOUS
  Filled 2017-01-17: qty 30

## 2017-01-17 MED ORDER — LABETALOL HCL 5 MG/ML IV SOLN: 40.0000 mg | Freq: Once | INTRAVENOUS | Status: DC | PRN

## 2017-01-17 MED ORDER — ONDANSETRON HCL 4 MG/2ML IJ SOLN: 4.0000 mg | Freq: Four times a day (QID) | INTRAMUSCULAR | Status: DC | PRN

## 2017-01-17 MED ORDER — OXYTOCIN BOLUS FROM INFUSION
500.0000 mL | Freq: Once | INTRAVENOUS | Status: AC
  Administered 2017-01-18: 500 mL via INTRAVENOUS

## 2017-01-17 MED ORDER — NIFEDIPINE ER OSMOTIC RELEASE 30 MG PO TB24
30.0000 mg | ORAL_TABLET | Freq: Every day | ORAL | Status: DC
  Administered 2017-01-17: 30 mg via ORAL
  Filled 2017-01-17 (×2): qty 1

## 2017-01-17 MED ORDER — FENTANYL CITRATE (PF) 100 MCG/2ML IJ SOLN
100.0000 ug | INTRAMUSCULAR | Status: DC | PRN
  Administered 2017-01-17: 100 ug via INTRAVENOUS
  Filled 2017-01-17: qty 2

## 2017-01-17 NOTE — Progress Notes (Signed)
OB PN:  S: Starting to feel more painful contractions  O: BP (!) 148/97   Pulse 100   Temp (!) 97.5 F (36.4 C) (Oral)   Resp 18   Ht 5\' 9"BffeGrnOeo$  (1.753 m)   Wt 121.1 kg (267 lb)   LMP 04/27/2016 (Approximate)   BMI 39.43 kg/m   FHT: 140bpm, moderate variablity, + accels, no decels Toco: q2-365min SVE: 2/50/-3, Cook placed  A/P: 26 y.o. G2P0010 @ 2520w6d for IOL due to cHTN 1. FWB: Cat. I 2. Labor: s/p cytotec #3, Cook balloon placed, plan to transition to Procardia Pain: IV or epidural upon request GBS: neg -chronic HTN: BP remains stable, continue Procardia XL 30mg  daily, labs stable as above.  asymptomatic  Myna HidalgoJennifer Hatsumi Brewer, [place] [phone] (pager) [phone] (office)

## 2017-01-17 NOTE — Anesthesia Preprocedure Evaluation (Signed)
Anesthesia Evaluation  Patient identified by MRN, date of birth, ID band Patient awake    Reviewed: Allergy & Precautions, H&P , NPO status , Patient's Chart, lab work & pertinent test results  History of Anesthesia Complications Negative for: history of anesthetic complications  Airway Mallampati: II  TM Distance: >3 FB Neck ROM: full    Dental no notable dental hx. (+) Teeth Intact   Pulmonary neg pulmonary ROS, former smoker,    Pulmonary exam normal breath sounds clear to auscultation       Cardiovascular hypertension, Pt. on medications Normal cardiovascular exam Rhythm:regular Rate:Normal     Neuro/Psych negative neurological ROS  negative psych ROS   GI/Hepatic negative GI ROS, Neg liver ROS,   Endo/Other  negative endocrine ROS  Renal/GU negative Renal ROS  negative genitourinary   Musculoskeletal   Abdominal   Peds  Hematology negative hematology ROS (+)   Anesthesia Other Findings   Reproductive/Obstetrics (+) Pregnancy                             Anesthesia Physical Anesthesia Plan  ASA: II  Anesthesia Plan: Epidural   Post-op Pain Management:    Induction:   PONV Risk Score and Plan:   Airway Management Planned:   Additional Equipment:   Intra-op Plan:   Post-operative Plan:   Informed Consent: I have reviewed the patients History and Physical, chart, labs and discussed the procedure including the risks, benefits and alternatives for the proposed anesthesia with the patient or authorized representative who has indicated his/her understanding and acceptance.     Plan Discussed with:   Anesthesia Plan Comments:         Anesthesia Quick Evaluation

## 2017-01-17 NOTE — Progress Notes (Signed)
OB PN:  S: Pt resting comfortably, no acute complaints.  No headache, no blurry vision.  O: BP (!) 146/87   Pulse 100   Temp 98.6 F (37 C) (Oral)   Resp 16   Ht 5\' 9"  (1.753 m)   Wt 121.1 kg (267 lb)   LMP 04/27/2016 (Approximate)   BMI 39.43 kg/m   FHT: 130bpm, moderate variablity, + accels, no decels Toco: occasional SVE: 1/25/-3, cytotec placed  Results for orders placed or performed during the hospital encounter of 01/17/17 (from the past 24 hour(s))  Comprehensive metabolic panel     Status: Abnormal   Collection Time: 01/17/17  1:25 AM  Result Value Ref Range   Sodium 133 (L) 135 - 145 mmol/L   Potassium 3.8 3.5 - 5.1 mmol/L   Chloride 105 101 - 111 mmol/L   CO2 19 (L) 22 - 32 mmol/L   Glucose, Bld 85 65 - 99 mg/dL   BUN 9 6 - 20 mg/dL   Creatinine, Ser 1.610.54 0.44 - 1.00 mg/dL   Calcium 8.9 8.9 - 09.610.3 mg/dL   Total Protein 6.6 6.5 - 8.1 g/dL   Albumin 2.8 (L) 3.5 - 5.0 g/dL   AST 27 15 - 41 U/L   ALT 16 14 - 54 U/L   Alkaline Phosphatase 135 (H) 38 - 126 U/L   Total Bilirubin 0.4 0.3 - 1.2 mg/dL   GFR calc non Af Amer >60 >60 mL/min   GFR calc Af Amer >60 >60 mL/min   Anion gap 9 5 - 15  CBC     Status: Abnormal   Collection Time: 01/17/17  1:25 AM  Result Value Ref Range   WBC 10.5 4.0 - 10.5 K/uL   RBC 3.61 (L) 3.87 - 5.11 MIL/uL   Hemoglobin 11.1 (L) 12.0 - 15.0 g/dL   HCT 04.532.9 (L) 40.936.0 - 81.146.0 %   MCV 91.1 78.0 - 100.0 fL   MCH 30.7 26.0 - 34.0 pg   MCHC 33.7 30.0 - 36.0 g/dL   RDW 91.414.9 78.211.5 - 95.615.5 %   Platelets 217 150 - 400 K/uL  Type and screen [facility]     Status: None   Collection Time: 01/17/17  1:25 AM  Result Value Ref Range   ABO/RH(D) O POS    Antibody Screen NEG    Sample Expiration 01/20/2017   ABO/Rh     Status: None   Collection Time: 01/17/17  1:25 AM  Result Value Ref Range   ABO/RH(D) O POS   Protein / creatinine ratio, urine     Status: Abnormal   Collection Time: 01/17/17  2:06 AM  Result Value Ref Range    Creatinine, Urine 55.00 mg/dL   Total Protein, Urine 10 mg/dL   Protein Creatinine Ratio 0.18 (H) 0.00 - 0.15 mg/mg[Cre]    A/P: 26 y.o. G2P0010 @ 6141w6d for IOL due to cHTN 1. FWB: Cat. I 2. Labor: cytotec # 2 placed, plan for 3rd cytotec this am Pain: IV or epidural upon request GBS: neg -ok for light breakfast this am, then pt to be clear liquids only -chronic HTN: BP remains stable, continue Procardia XL 30mg  daily, labs stable as above.  asymptomatic  Myna HidalgoJennifer Wayde Gopaul, [place] [phone] (pager) [phone] (office)

## 2017-01-17 NOTE — Anesthesia Pain Management Evaluation Note (Signed)
  [facility] Pain Management Visit Note  Patient: Sarah Brewer, 26 y.o., female  "Hello [facility] We have an anesthesia team available at all times to provide care throughout the hospital, including epidural management and anesthesia for C-section. I don't know your plan for the delivery whether it a natural birth, water birth, IV sedation, nitrous supplementation, doula or epidural, but we want to meet your pain goals."   1.Was your pain managed to your expectations on prior hospitalizations?   No prior hospitalizations  2.What is your expectation for pain management during this hospitalization?     Epidural  3.How can we help you reach that goal?   Record the patient's initial score and the patient's pain goal.   Pain: 2  Pain Goal: 5 [facility] wants you to be able to say your pain was always managed very well.  Laban EmperorMalinova,Yanett Conkright Hristova 01/17/2017

## 2017-01-17 NOTE — Anesthesia Procedure Notes (Signed)
Epidural Patient location during procedure: OB  Staffing Anesthesiologist: Baley Shands, MD Performed: anesthesiologist   Preanesthetic Checklist Completed: patient identified, site marked, surgical consent, pre-op evaluation, timeout performed, IV checked, risks and benefits discussed and monitors and equipment checked  Epidural Patient position: sitting Prep: DuraPrep Patient monitoring: heart rate, continuous pulse ox and blood pressure Approach: right paramedian Location: L3-L4 Injection technique: LOR saline  Needle:  Needle type: Tuohy  Needle gauge: 17 G Needle length: 9 cm and 9 Needle insertion depth: 7 cm Catheter type: closed end flexible Catheter size: 20 Guage Catheter at skin depth: 11 cm Test dose: negative  Assessment Events: blood not aspirated, injection not painful, no injection resistance, negative IV test and no paresthesia  Additional Notes Patient identified. Risks/Benefits/Options discussed with patient including but not limited to bleeding, infection, nerve damage, paralysis, failed block, incomplete pain control, headache, blood pressure changes, nausea, vomiting, reactions to medication both or allergic, itching and postpartum back pain. Confirmed with bedside nurse the patient's most recent platelet count. Confirmed with patient that they are not currently taking any anticoagulation, have any bleeding history or any family history of bleeding disorders. Patient expressed understanding and wished to proceed. All questions were answered. Sterile technique was used throughout the entire procedure. Please see nursing notes for vital signs. Test dose was given through epidural needle and negative prior to continuing to dose epidural or start infusion. Warning signs of high block given to the patient including shortness of breath, tingling/numbness in hands, complete motor block, or any concerning symptoms with instructions to call for help. Patient was given  instructions on fall risk and not to get out of bed. All questions and concerns addressed with instructions to call with any issues.     

## 2017-01-17 NOTE — Progress Notes (Signed)
OB PN:  S: Resting comfortably with epidural  O: BP 117/74   Pulse 92   Temp 98 F (36.7 C) (Oral)   Resp 17   Ht 5\' 9"  (1.753 m)   Wt 121.1 kg (267 lb)   LMP 04/27/2016 (Approximate)   SpO2 100%   BMI 39.43 kg/m   FHT: 135bpm, moderate variablity, + accels, no decels Toco: difficulty picking up contractions, q2-305min SVE: 5/80/-2, AROM moderate blood-tinged mostly clear fluid, IUPC placed  A/P: 26 y.o. G2P0010 @ 5020w6d for IOL due to cHTN 1. FWB: Cat. I 2. Labor: continue Pit per protocol, IUPC placed for further assessment of contractions Pain: continue with epidural GBS: neg -chronic HTN: BP remains stable, continue Procardia XL 30mg  daily  Myna HidalgoJennifer Mazy Culton, DO [phone] (pager) [phone] (office)

## 2017-01-18 ENCOUNTER — Encounter (HOSPITAL_COMMUNITY): Payer: Self-pay

## 2017-01-18 LAB — CBC
HCT: 34.4 % — ABNORMAL LOW (ref 36.0–46.0)
HCT: 30.4 % — ABNORMAL LOW (ref 36.0–46.0)
Hemoglobin: 11.6 g/dL — ABNORMAL LOW (ref 12.0–15.0)
Hemoglobin: 10.4 g/dL — ABNORMAL LOW (ref 12.0–15.0)
MCH: 30.4 pg (ref 26.0–34.0)
MCH: 30.8 pg (ref 26.0–34.0)
MCHC: 33.7 g/dL (ref 30.0–36.0)
MCHC: 34.2 g/dL (ref 30.0–36.0)
MCV: 90.3 fL (ref 78.0–100.0)
MCV: 89.9 fL (ref 78.0–100.0)
Platelets: 226 10*3/uL (ref 150–400)
Platelets: 202 10*3/uL (ref 150–400)
RBC: 3.81 MIL/uL — ABNORMAL LOW (ref 3.87–5.11)
RBC: 3.38 MIL/uL — ABNORMAL LOW (ref 3.87–5.11)
RDW: 14.7 % (ref 11.5–15.5)
RDW: 14.8 % (ref 11.5–15.5)
WBC: 17.2 10*3/uL — ABNORMAL HIGH (ref 4.0–10.5)
WBC: 15.8 10*3/uL — ABNORMAL HIGH (ref 4.0–10.5)

## 2017-01-18 MED ORDER — DIPHENHYDRAMINE HCL 25 MG PO CAPS: 25.0000 mg | ORAL_CAPSULE | Freq: Four times a day (QID) | ORAL | Status: DC | PRN

## 2017-01-18 MED ORDER — ONDANSETRON HCL 4 MG PO TABS: 4.0000 mg | ORAL_TABLET | ORAL | Status: DC | PRN

## 2017-01-18 MED ORDER — SIMETHICONE 80 MG PO CHEW: 80.0000 mg | CHEWABLE_TABLET | ORAL | Status: DC | PRN

## 2017-01-18 MED ORDER — CARBOPROST TROMETHAMINE 250 MCG/ML IM SOLN
INTRAMUSCULAR | Status: AC
  Filled 2017-01-18: qty 1

## 2017-01-18 MED ORDER — BENZOCAINE-MENTHOL 20-0.5 % EX AERO
1.0000 "application " | INHALATION_SPRAY | CUTANEOUS | Status: DC | PRN
  Administered 2017-01-18 – 2017-01-19 (×2): 1 via TOPICAL
  Filled 2017-01-18 (×2): qty 56

## 2017-01-18 MED ORDER — WITCH HAZEL-GLYCERIN EX PADS: 1.0000 "application " | MEDICATED_PAD | CUTANEOUS | Status: DC | PRN

## 2017-01-18 MED ORDER — DIBUCAINE 1 % RE OINT: 1.0000 "application " | TOPICAL_OINTMENT | RECTAL | Status: DC | PRN

## 2017-01-18 MED ORDER — ONDANSETRON HCL 4 MG/2ML IJ SOLN: 4.0000 mg | INTRAMUSCULAR | Status: DC | PRN

## 2017-01-18 MED ORDER — ZOLPIDEM TARTRATE 5 MG PO TABS: 5.0000 mg | ORAL_TABLET | Freq: Every evening | ORAL | Status: DC | PRN

## 2017-01-18 MED ORDER — SENNOSIDES-DOCUSATE SODIUM 8.6-50 MG PO TABS
2.0000 | ORAL_TABLET | ORAL | Status: DC
  Filled 2017-01-18: qty 2

## 2017-01-18 MED ORDER — PRENATAL MULTIVITAMIN CH
1.0000 | ORAL_TABLET | Freq: Every day | ORAL | Status: DC
  Administered 2017-01-18 – 2017-01-20 (×3): 1 via ORAL
  Filled 2017-01-18 (×3): qty 1

## 2017-01-18 MED ORDER — IBUPROFEN 600 MG PO TABS
600.0000 mg | ORAL_TABLET | Freq: Four times a day (QID) | ORAL | Status: DC
  Administered 2017-01-18 – 2017-01-20 (×10): 600 mg via ORAL
  Filled 2017-01-18 (×11): qty 1

## 2017-01-18 MED ORDER — NIFEDIPINE ER OSMOTIC RELEASE 30 MG PO TB24
30.0000 mg | ORAL_TABLET | Freq: Every day | ORAL | Status: DC
  Administered 2017-01-18 – 2017-01-20 (×3): 30 mg via ORAL
  Filled 2017-01-18 (×3): qty 1

## 2017-01-18 MED ORDER — COCONUT OIL OIL: 1.0000 "application " | TOPICAL_OIL | Status: DC | PRN

## 2017-01-18 MED ORDER — ACETAMINOPHEN 325 MG PO TABS
650.0000 mg | ORAL_TABLET | ORAL | Status: DC | PRN
Start: 2017-01-18 — End: 2017-01-20
  Administered 2017-01-18 – 2017-01-20 (×5): 650 mg via ORAL
  Filled 2017-01-18 (×5): qty 2

## 2017-01-18 NOTE — Lactation Note (Signed)
This note was copied from a baby's chart. Lactation Consultation Note  Patient Name: Sarah Brewer ZOXWR'UToday's Date: 01/18/2017 Reason for consult: Initial assessment;1st time breastfeeding   Maternal Data Has patient been taught Hand Expression?: Yes Does the patient have breastfeeding experience prior to this delivery?: No  Follow up visit at 13 hours of age.  Mom reports baby had several good feedings and then has been sleepy.  Mom recently placed baby STS and baby remained asleep.  Mom denies pain with latching.  NT at bedside for hearing screening.  LC discussed basics with mom.  LC encouraged mom to feeding baby with early feeding cues.  [facility] resources given and discussed.  Encouraged to feed with early cues on demand.  Early newborn behavior discussed.  Hand expression demonstrated with colostrum visible.  Mom to call for assist as needed.    Feeding Feeding Type: Breast Fed Length of feed: 0 min  LATCH Score                   Interventions Interventions: Breast feeding basics reviewed  Lactation Tools Discussed/Used WIC Program: Yes(already seen in hospital)   Consult Status Consult Status: Follow-up Date: 01/19/17 Follow-up type: In-patient    Franz DellJana Dareon Nunziato 01/18/2017, 2:52 PM

## 2017-01-18 NOTE — Anesthesia Postprocedure Evaluation (Signed)
Anesthesia Post Note  Patient: Sarah Brewer  Procedure(s) Performed: AN AD HOC LABOR EPIDURAL     Patient location during evaluation: Mother Baby Anesthesia Type: Epidural Level of consciousness: awake and alert Pain management: pain level controlled Vital Signs Assessment: post-procedure vital signs reviewed and stable Respiratory status: spontaneous breathing Cardiovascular status: blood pressure returned to baseline Postop Assessment: no headache, adequate PO intake, no backache, patient able to bend at knees, epidural receding and no apparent nausea or vomiting Anesthetic complications: no    Last Vitals:  Vitals:   01/18/17 0430 01/18/17 0529  BP: (!) 144/78 (!) 141/76  Pulse: 93 80  Resp: 18 18  Temp: 36.9 C   SpO2:      Last Pain:  Vitals:   01/18/17 0529  TempSrc:   PainSc: 4    Pain Goal:                 [name]     

## 2017-01-18 NOTE — Anesthesia Postprocedure Evaluation (Signed)
Anesthesia Post Note  Patient: Sarah Brewer  Procedure(s) Performed: AN AD HOC LABOR EPIDURAL     Patient location during evaluation: Mother Baby Anesthesia Type: Epidural Level of consciousness: awake and alert Pain management: pain level controlled Vital Signs Assessment: post-procedure vital signs reviewed and stable Respiratory status: spontaneous breathing Cardiovascular status: blood pressure returned to baseline Postop Assessment: no headache, adequate PO intake, no backache, patient able to bend at knees, epidural receding and no apparent nausea or vomiting Anesthetic complications: no    Last Vitals:  Vitals:   01/18/17 0430 01/18/17 0529  BP: (!) 144/78 (!) 141/76  Pulse: 93 80  Resp: 18 18  Temp: 36.9 C   SpO2:      Last Pain:  Vitals:   01/18/17 0529  TempSrc:   PainSc: 4    Pain Goal:                 Salome ArntSterling, Xariah Silvernail Marie

## 2017-01-19 MED ORDER — IBUPROFEN 600 MG PO TABS: 600.0000 mg | ORAL_TABLET | Freq: Four times a day (QID) | ORAL | 0 refills | Status: DC

## 2017-01-19 NOTE — Discharge Instructions (Signed)
Vaginal Delivery, Care After °Refer to this sheet in the next few weeks. These instructions provide you with information about caring for yourself after vaginal delivery. Your health care provider may also give you more specific instructions. Your treatment has been planned according to current medical practices, but problems sometimes occur. Call your health care provider if you have any problems or questions. °What can I expect after the procedure? °After vaginal delivery, it is common to have: °· Some bleeding from your vagina. °· Soreness in your abdomen, your vagina, and the area of skin between your vaginal opening and your anus (perineum). °· Pelvic cramps. °· Fatigue. ° °Follow these instructions at home: °Medicines °· Take over-the-counter and prescription medicines only as told by your health care provider. °· If you were prescribed an antibiotic medicine, take it as told by your health care provider. Do not stop taking the antibiotic until it is finished. °Driving ° °· Do not drive or operate heavy machinery while taking prescription pain medicine. °· Do not drive for 24 hours if you received a sedative. °Lifestyle °· Do not drink alcohol. This is especially important if you are breastfeeding or taking medicine to relieve pain. °· Do not use tobacco products, including cigarettes, chewing tobacco, or e-cigarettes. If you need help quitting, ask your health care provider. °Eating and drinking °· Drink at least 8 eight-ounce glasses of water every day unless you are told not to by your health care provider. If you choose to breastfeed your baby, you may need to drink more water than this. °· Eat high-fiber foods every day. These foods may help prevent or relieve constipation. High-fiber foods include: °? Whole grain cereals and breads. °? Brown rice. °? Beans. °? Fresh fruits and vegetables. °Activity °· Return to your normal activities as told by your health care provider. Ask your health care provider  what activities are safe for you. °· Rest as much as possible. Try to rest or take a nap when your baby is sleeping. °· Do not lift anything that is heavier than your baby or 10 lb (4.5 kg) until your health care provider says that it is safe. °· Talk with your health care provider about when you can engage in sexual activity. This may depend on your: °? Risk of infection. °? Rate of healing. °? Comfort and desire to engage in sexual activity. °Vaginal Care °· If you have an episiotomy or a vaginal tear, check the area every day for signs of infection. Check for: °? More redness, swelling, or pain. °? More fluid or blood. °? Warmth. °? Pus or a bad smell. °· Do not use tampons or douches until your health care provider says this is safe. °· Watch for any blood clots that may pass from your vagina. These may look like clumps of dark red, brown, or black discharge. °General instructions °· Keep your perineum clean and dry as told by your health care provider. °· Wear loose, comfortable clothing. °· Wipe from front to back when you use the toilet. °· Ask your health care provider if you can shower or take a bath. If you had an episiotomy or a perineal tear during labor and delivery, your health care provider may tell you not to take baths for a certain length of time. °· Wear a bra that supports your breasts and fits you well. °· If possible, have someone help you with household activities and help care for your baby for at least a few days after   you leave the hospital. °· Keep all follow-up visits for you and your baby as told by your health care provider. This is important. °Contact a health care provider if: °· You have: °? Vaginal discharge that has a bad smell. °? Difficulty urinating. °? Pain when urinating. °? A sudden increase or decrease in the frequency of your bowel movements. °? More redness, swelling, or pain around your episiotomy or vaginal tear. °? More fluid or blood coming from your episiotomy or  vaginal tear. °? Pus or a bad smell coming from your episiotomy or vaginal tear. °? A fever. °? A rash. °? Little or no interest in activities you used to enjoy. °? Questions about caring for yourself or your baby. °· Your episiotomy or vaginal tear feels warm to the touch. °· Your episiotomy or vaginal tear is separating or does not appear to be healing. °· Your breasts are painful, hard, or turn red. °· You feel unusually sad or worried. °· You feel nauseous or you vomit. °· You pass large blood clots from your vagina. If you pass a blood clot from your vagina, save it to show to your health care provider. Do not flush blood clots down the toilet without having your health care provider look at them. °· You urinate more than usual. °· You are dizzy or light-headed. °· You have not breastfed at all and you have not had a menstrual period for 12 weeks after delivery. °· You have stopped breastfeeding and you have not had a menstrual period for 12 weeks after you stopped breastfeeding. °Get help right away if: °· You have: °? Pain that does not go away or does not get better with medicine. °? Chest pain. °? Difficulty breathing. °? Blurred vision or spots in your vision. °? Thoughts about hurting yourself or your baby. °· You develop pain in your abdomen or in one of your legs. °· You develop a severe headache. °· You faint. °· You bleed from your vagina so much that you fill two sanitary pads in one hour. °This information is not intended to replace advice given to you by your health care provider. Make sure you discuss any questions you have with your health care provider. °Document Released: 01/14/2000 Document Revised: 06/30/2015 Document Reviewed: 01/31/2015 °Elsevier Interactive Patient Education © 2018 [employer] ° °

## 2017-01-19 NOTE — Lactation Note (Signed)
This note was copied from a baby's chart. Lactation Consultation Note  Patient Name: Sarah Brewer WUJWJ'XToday's Date: 01/19/2017   P1, Baby 32 hours old.  Mother easily expressed flow of colostrum. Mother latched baby in football hold with minimal assistance.  Baby did get sleepy during feeding after approx 12 min of feeding. Mom encouraged to feed baby 8-12 times/24 hours and with feeding cues.  Provided mother w/ manual pump. Reviewed engorgement care and monitoring voids/stools. Wake baby for feedings.      Maternal Data    Feeding    LATCH Score                   Interventions    Lactation Tools Discussed/Used     Consult Status      Dahlia ByesBerkelhammer, Ruth [facility] LP[facility] 01/19/2017, 10:02 AM

## 2017-01-19 NOTE — Progress Notes (Signed)
Postpartum Note Day # 1  S:  Patient resting comfortable in bed.  Pain controlled.  Tolerating general diet. + flatus, + BM.  Lochia moderate.  Ambulating without difficulty.  She denies n/v/f/c, SOB, or CP.  No headache, no blurry vision. Pt plans on breastfeeding.  O: Temp:  [98.2 F (36.8 C)] 98.2 F (36.8 C) (12/21 0615) Resp:  [18] 18 (12/21 0615) BP: (145)/(99) 145/99 (12/21 0615) Gen: A&Ox3, NAD CV: RRR, no MRG Resp: CTAB Abdomen: soft, NT, ND Uterus: firm, non-tender, below umbilicus Ext: No edema, no calf tenderness bilaterally  Labs:  Recent Labs    01/18/17 0334 01/18/17 0650  HGB 11.6* 10.4*    A/P: Pt is a 26 y.o. G2P1011 s/p VAVD, PPD#1  - Pain well controlled -GU: UOP is adequate -GI: Tolerating general diet -Activity: encouraged sitting up to chair and ambulation as tolerated -Prophylaxis: early ambulation -Labs: stable as above -chronic HTN: continue with Procardia 30mg  XL daily  Plan for discharge home tomorrow as she is meeting all postpartum milestones.  Myna HidalgoJennifer Cong Hightower, DO [phone] (pager) [phone] (office)

## 2017-01-19 NOTE — Plan of Care (Signed)
Progressing appropriately. Mother expresses good comfort level with breastfeeding process. Encouraged to call for assistance as needed with feeding and for [facility].

## 2017-01-19 NOTE — Discharge Summary (Deleted)
OB Discharge Summary     Patient Name: Sarah Brewer DOB: [birth year] MRN: [id number]  Date of admission: 01/17/2017 Delivering MD: Myna HidalgoZAN, Cynthia Cogle   Date of discharge: 01/19/2017  Admitting diagnosis: INDUCTION Intrauterine pregnancy: 6462w0d     Secondary diagnosis:  Active Problems:   Chronic hypertension in pregnancy  Additional problems: obesity     Discharge diagnosis: Term Pregnancy Delivered and CHTN                                                                                                Post partum procedures:none  Augmentation: AROM, Pitocin, Cytotec and Foley Balloon  Complications: None  Hospital course:  Induction of Labor With Vaginal Delivery   26 y.o. yo G2P1011 at 8362w0d was admitted to the hospital 01/17/2017 for induction of labor.  Indication for induction: chronic HTN.  Patient had an uncomplicated labor course as follows: Membrane Rupture Time/Date: 8:13 PM ,01/17/2017   Intrapartum Procedures: Episiotomy: None [1]                                         Lacerations:  2nd degree [3];Perineal [11];Periurethral [8];Labial [10]  Patient had delivery of a Viable infant.  Information for the patient's newborn:  Anette GuarneriGreen, Girl Eric [[id number]][[id number]]  Delivery Method: Vaginal, Vacuum (Extractor)(Filed from Delivery Summary)   01/18/2017  Details of delivery can be found in separate delivery note.  Patient had a routine postpartum course. Patient is discharged home 01/19/17.  Physical exam  Vitals:   01/18/17 1008 01/18/17 1100 01/18/17 1700 01/19/17 0615  BP: (!) 142/78 138/86 (!) 142/88 (!) 145/99  Pulse: 84 78 89   Resp: 18  18 18   Temp:   98.3 F (36.8 C) 98.2 F (36.8 C)  TempSrc:   Oral Oral  SpO2:      Weight:      Height:       General: alert, cooperative and no distress  CV: RRR Lungs:CTAB Abd: soft, non-tender Lochia: appropriate Uterine Fundus: firm, below umbilius Incision: N/A DVT Evaluation: No evidence of DVT seen on physical exam. No  edema  Labs: Lab Results  Component Value Date   WBC 15.8 (H) 01/18/2017   HGB 10.4 (L) 01/18/2017   HCT 30.4 (L) 01/18/2017   MCV 89.9 01/18/2017   PLT 202 01/18/2017   CMP Latest Ref Rng & Units 01/17/2017  Glucose 65 - 99 mg/dL 85  BUN 6 - 20 mg/dL 9  Creatinine 7.820.44 - 9.561.00 mg/dL 2.130.54  Sodium 086135 - 578145 mmol/L 133(L)  Potassium 3.5 - 5.1 mmol/L 3.8  Chloride 101 - 111 mmol/L 105  CO2 22 - 32 mmol/L 19(L)  Calcium 8.9 - 10.3 mg/dL 8.9  Total Protein 6.5 - 8.1 g/dL 6.6  Total Bilirubin 0.3 - 1.2 mg/dL 0.4  Alkaline Phos 38 - 126 U/L 135(H)  AST 15 - 41 U/L 27  ALT 14 - 54 U/L 16    Discharge instruction: per After Visit Summary and "Baby and Me Booklet".  After  visit meds:  Allergies as of 01/19/2017      Reactions   Penicillins Rash   Has patient had a PCN reaction causing immediate rash, facial/tongue/throat swelling, SOB or lightheadedness with hypotension: Yes Has patient had a PCN reaction causing severe rash involving mucus membranes or skin necrosis: Yes Has patient had a PCN reaction that required hospitalization: No Has patient had a PCN reaction occurring within the last 10 years: No If all of the above answers are "NO", then may proceed with Cephalosporin use.      Medication List    STOP taking these medications   ferrous sulfate 325 (65 FE) MG tablet     TAKE these medications   ibuprofen 600 MG tablet Commonly known as:  ADVIL,MOTRIN Take 1 tablet (600 mg total) by mouth every 6 (six) hours.   labetalol 100 MG tablet Commonly known as:  NORMODYNE Take 2 tablets (200 mg total) by mouth every 12 (twelve) hours.   NIFEdipine 30 MG 24 hr tablet Commonly known as:  PROCARDIA-XL/ADALAT-CC/NIFEDICAL-XL TK 1 T PO QD   PRENATAL VITAMIN PO Take by mouth.       Diet: routine diet  Activity: Advance as tolerated. Pelvic rest for 6 weeks.   Outpatient follow up:1 week Follow up Appt:No future appointments. Follow up Visit:No Follow-up on  file.  Postpartum contraception: Undecided  Newborn Data: Live born female  Birth Weight: 6 lb 7 oz (2920 g) APGAR: 7, 9  Newborn Delivery   Birth date/time:  01/18/2017 01:41:00 Delivery type:  Vaginal, Vacuum (Extractor)     Baby Feeding: Breast Disposition:home with mother   01/19/2017 Sharon SellerJennifer M Perle Brickhouse, DO

## 2017-01-20 ENCOUNTER — Other Ambulatory Visit: Payer: Self-pay

## 2017-01-20 NOTE — Progress Notes (Signed)
Post Partum Day 1 Subjective: no complaints, up ad lib, voiding and tolerating PO  Objective: Blood pressure 140/81, pulse 77, temperature 98.4 F (36.9 C), temperature source Oral, resp. rate 18, height 5\' 9"  (1.753 m), weight 121.1 kg (267 lb), last menstrual period 04/27/2016, SpO2 100 %, unknown if currently breastfeeding.  Physical Exam:  General: alert, cooperative and no distress Lochia: appropriate Uterine Fundus: firm Incision: NA DVT Evaluation: No evidence of DVT seen on physical exam.  Recent Labs    01/18/17 0334 01/18/17 0650  HGB 11.6* 10.4*  HCT 34.4* 30.4*    Assessment/Plan: Discharge home  Follow up in office in 1 week for bp check    LOS: 3 days   Jozeph Persing J. 01/20/2017, 10:21 AM

## 2017-01-20 NOTE — Lactation Note (Signed)
This note was copied from a baby's chart. Lactation Consultation Note; Mom reports baby has been nursing well. LS 10 by bedside RN. Reports a little pain with initial latch that eases off after a few minutes. Encouraged breast massage and hand expression before latching baby. Asking about creams to use on nipples- encouraged EBM and coconut oil. No further questions at present. To call prn  Patient Name: Sarah Brewer Reason for consult: Follow-up assessment   Maternal Data Formula Feeding for Exclusion: Yes Reason for exclusion: Mother's choice to formula and breast feed on admission;Mother's choice to formula feed on admision Has patient been taught Hand Expression?: Yes Does the patient have breastfeeding experience prior to this delivery?: No  Feeding Feeding Type: Breast Fed Length of feed: 30 min  LATCH Score                   Interventions    Lactation Tools Discussed/Used     Consult Status Consult Status: Complete    Pamelia HoitWeeks, Jeremih Dearmas D Brewer, 8:46 AM

## 2017-02-14 NOTE — Discharge Summary (Signed)
OB Discharge Summary     Patient Name: Sarah Brewer DOB: 1990-04-25 MRN: [id number]  Date of admission: 01/17/2017 Delivering MD: Myna HidalgoZAN, Amiee Wiley   Date of discharge: 01/20/2018  Admitting diagnosis: INDUCTION Intrauterine pregnancy: 2255w0d     Secondary diagnosis:  Active Problems:   Chronic hypertension in pregnancy  Additional problems: obesity     Discharge diagnosis: Term Pregnancy Delivered and CHTN                                                                                                Post partum procedures: neg  Augmentation: AROM, Pitocin, Cytotec and Foley Balloon  Complications: None  Hospital course:  Induction of Labor With Vaginal Delivery   27 y.o. yo G2P1011 at 1355w0d was admitted to the hospital 01/17/2017 for induction of labor.  Indication for induction: chronic HTN.  Patient had an uncomplicated labor course as follows: Membrane Rupture Time/Date: 8:13 PM ,01/17/2017   Intrapartum Procedures: Episiotomy: None [1]                                         Lacerations:  2nd degree [3];Perineal [11];Periurethral [8];Labial [10]  Patient had delivery of a Viable infant.  Information for the patient's newborn:  Caryl ComesGattis, Haven Zaniyah [[id number]][[id number]]  Delivery Method: Vaginal, Vacuum (Extractor)(Filed from Delivery Summary)   01/18/2017  Details of delivery can be found in separate delivery note.  Patient had a routine postpartum course. Patient is discharged home 02/14/17.  Physical exam  Vitals:   01/18/17 1700 01/19/17 0615 01/19/17 1855 01/20/17 0558  BP: (!) 142/88 (!) 145/99 (!) 136/93 140/81  Pulse: 89  88 77  Resp: 18 18 17 18   Temp: 98.3 F (36.8 C) 98.2 F (36.8 C) 98.2 F (36.8 C) 98.4 F (36.9 C)  TempSrc: Oral Oral Oral Oral  SpO2:      Weight:      Height:       General: alert, cooperative and no distress Lochia: appropriate Uterine Fundus: firm Incision: N/A DVT Evaluation: No evidence of DVT seen on physical exam. Labs: Lab  Results  Component Value Date   WBC 15.8 (H) 01/18/2017   HGB 10.4 (L) 01/18/2017   HCT 30.4 (L) 01/18/2017   MCV 89.9 01/18/2017   PLT 202 01/18/2017   CMP Latest Ref Rng & Units 01/17/2017  Glucose 65 - 99 mg/dL 85  BUN 6 - 20 mg/dL 9  Creatinine 7.250.44 - 3.661.00 mg/dL 4.400.54  Sodium 347135 - 425145 mmol/L 133(L)  Potassium 3.5 - 5.1 mmol/L 3.8  Chloride 101 - 111 mmol/L 105  CO2 22 - 32 mmol/L 19(L)  Calcium 8.9 - 10.3 mg/dL 8.9  Total Protein 6.5 - 8.1 g/dL 6.6  Total Bilirubin 0.3 - 1.2 mg/dL 0.4  Alkaline Phos 38 - 126 U/L 135(H)  AST 15 - 41 U/L 27  ALT 14 - 54 U/L 16    Discharge instruction: per After Visit Summary and "Baby and Me Booklet".  After visit meds:  Allergies as  of 01/20/2017      Reactions   Penicillins Rash   Has patient had a PCN reaction causing immediate rash, facial/tongue/throat swelling, SOB or lightheadedness with hypotension: Yes Has patient had a PCN reaction causing severe rash involving mucus membranes or skin necrosis: Yes Has patient had a PCN reaction that required hospitalization: No Has patient had a PCN reaction occurring within the last 10 years: No If all of the above answers are "NO", then may proceed with Cephalosporin use.      Medication List    STOP taking these medications   ferrous sulfate 325 (65 FE) MG tablet     TAKE these medications   ibuprofen 600 MG tablet Commonly known as:  ADVIL,MOTRIN Take 1 tablet (600 mg total) by mouth every 6 (six) hours.   labetalol 100 MG tablet Commonly known as:  NORMODYNE Take 2 tablets (200 mg total) by mouth every 12 (twelve) hours.   NIFEdipine 30 MG 24 hr tablet Commonly known as:  PROCARDIA-XL/ADALAT-CC/NIFEDICAL-XL TK 1 T PO QD   PRENATAL VITAMIN PO Take by mouth.       Diet: routine diet  Activity: Advance as tolerated. Pelvic rest for 6 weeks.   Outpatient follow up:2 weeks Follow up Appt:No future appointments. Follow up Visit:No Follow-up on file.  Postpartum  contraception: Undecided  Newborn Data: Live born female  Birth Weight: 6 lb 7 oz (2920 g) APGAR: 7, 9  Newborn Delivery   Birth date/time:  01/18/2017 01:41:00 Delivery type:  Vaginal, Vacuum (Extractor)     Baby Feeding: Breast Disposition:home with mother   02/14/2017 Sharon Seller, DO

## 2018-10-08 ENCOUNTER — Other Ambulatory Visit: Payer: Self-pay

## 2018-10-08 DIAGNOSIS — Z20822 Contact with and (suspected) exposure to covid-19: Secondary | ICD-10-CM

## 2018-10-10 LAB — NOVEL CORONAVIRUS, NAA: SARS-CoV-2, NAA: NOT DETECTED

## 2019-03-12 ENCOUNTER — Other Ambulatory Visit: Payer: Self-pay

## 2019-03-12 ENCOUNTER — Ambulatory Visit: Payer: Medicaid Other | Attending: Family

## 2019-03-12 DIAGNOSIS — Z20822 Contact with and (suspected) exposure to covid-19: Secondary | ICD-10-CM

## 2019-03-13 LAB — NOVEL CORONAVIRUS, NAA: SARS-CoV-2, NAA: NOT DETECTED

## 2019-03-20 IMAGING — US US MFM OB FOLLOW-UP
1 series · 14 of 28 positions shown · non-contrast
Comparison: none

1  NTUNGWANAYO BEULINE              [id number]       [id number]     [id number]
Indications

26 weeks gestation of pregnancy
Obesity complicating pregnancy, second
trimester
Hypertension - Chronic/Pre-existing
Antenatal follow-up for nonvisualized fetal
anatomy
OB History
Blood Type:            Height:  5'9"   Weight (lb):  245      BMI:
Gravidity:    2
TOP:          1
Fetal Evaluation
Num Of Fetuses:     1
Fetal Heart         151
Rate(bpm):
Cardiac Activity:   Observed
Presentation:       Cephalic
Placenta:           Anterior, above cervical os
P. Cord Insertion:  Previously Visualized
Amniotic Fluid
AFI FV:      Subjectively within normal limits
Biometry
BPD:      64.9  mm     G. Age:  26w 2d         48  %    CI:        73.57   %   70 - 86
FL/HC:      19.8   %   18.6 -
HC:      240.4  mm     G. Age:  26w 1d         29  %    HC/AC:      1.03       1.04 -
AC:      232.6  mm     G. Age:  27w 4d         85  %    FL/BPD:     73.3   %   71 - 87
FL:       47.6  mm     G. Age:  26w 0d         34  %    FL/AC:      20.5   %   20 - 24
Est. FW:     983  gm      2 lb 3 oz     70  %
Gestational Age
LMP:           26w 0d       Date:   04/27/16                 EDD:   02/01/17
U/S Today:     26w 4d                                        EDD:   01/28/17
Best:          26w 0d    Det. By:   LMP  (04/27/16)          EDD:   02/01/17
Anatomy
Cranium:               Appears normal         Aortic Arch:            Previously seen
Cavum:                 Appears normal         Ductal Arch:            Previously seen
Ventricles:            Appears normal         Diaphragm:              Previously seen
Choroid Plexus:        Previously seen        Stomach:                Appears normal, left
sided
Cerebellum:            Previously seen        Abdomen:                Appears normal
Posterior Fossa:       Previously seen        Abdominal Wall:         Previously seen
Nuchal Fold:           Not applicable (>20    Cord Vessels:           Previously seen
wks GA)
Face:                  Appears normal         Kidneys:                Appear normal
(orbits and profile)
Lips:                  Appears normal         Bladder:                Appears normal
Thoracic:              Appears normal         Spine:                  Previously seen
Heart:                 Echogenic focus        Upper Extremities:      Previously seen
in LV
RVOT:                  Appears normal         Lower Extremities:      Previously seen
LVOT:                  Appears normal
Other:  Fetus appears to be a female. Heels previously visualized.
Technically difficult due to maternal habitus and fetal position.
Cervix Uterus Adnexa
Cervix
Length:              4  cm.
Normal appearance by transabdominal scan.
Impression
INDICATION: 25 yr old [id number] at 29w6d with chronic
hypertension for fetal growth and reevaluate fetal anatomy.

[Series 1: us mfm ob follow-up · 42 acquisitions, 14 frames shown]
[im 2/42]
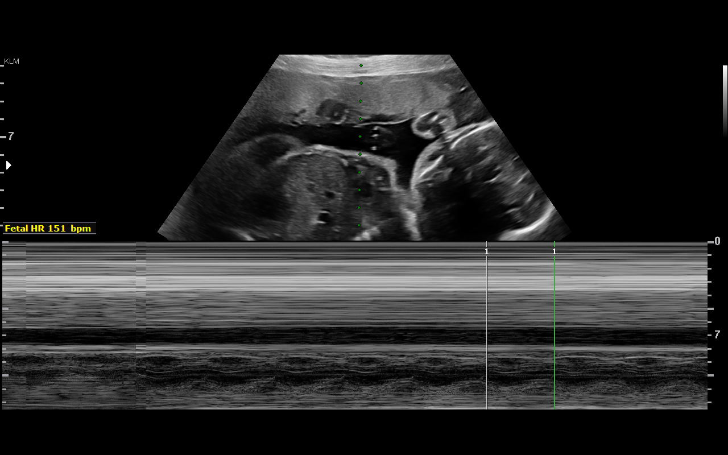
[im 5/42]
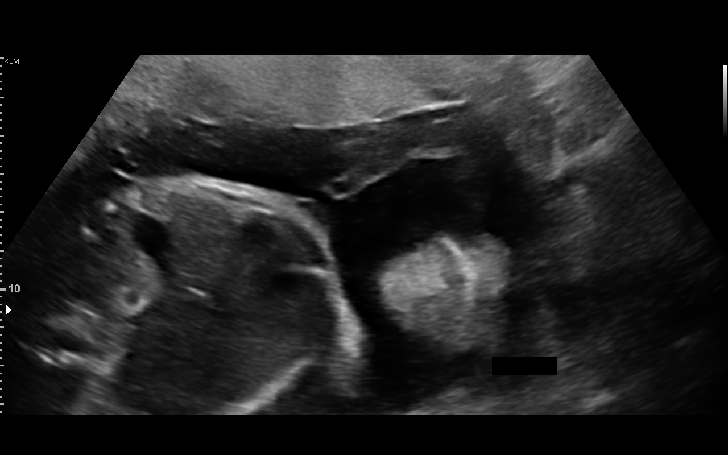
[im 8/42]
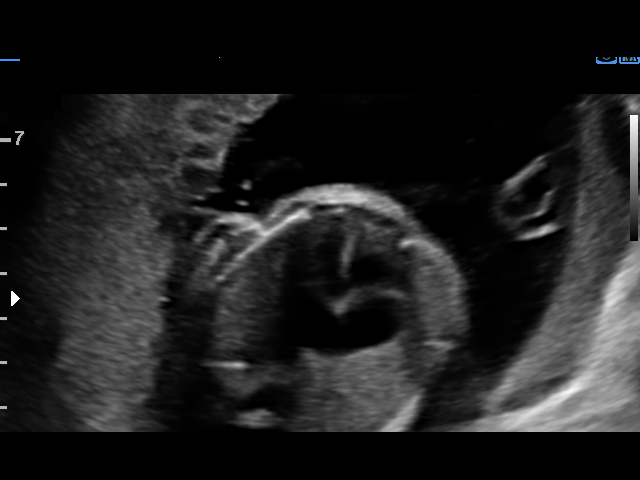
[im 11/42]
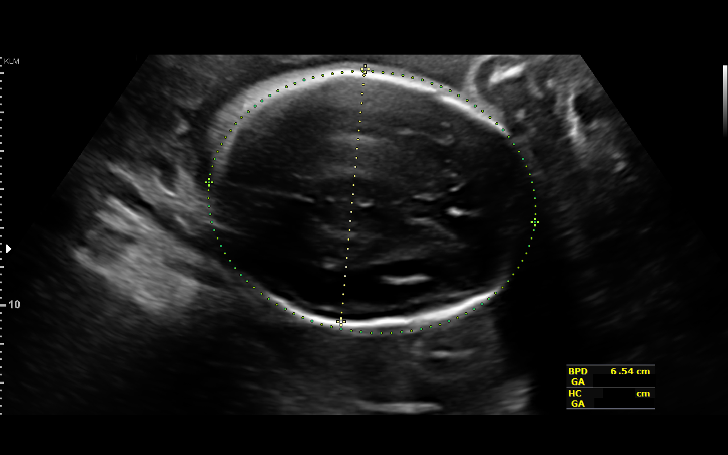
[im 14/42]
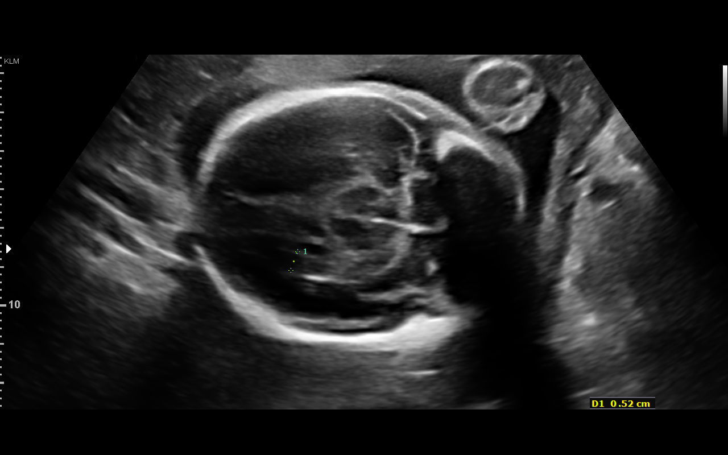
[im 17/42]
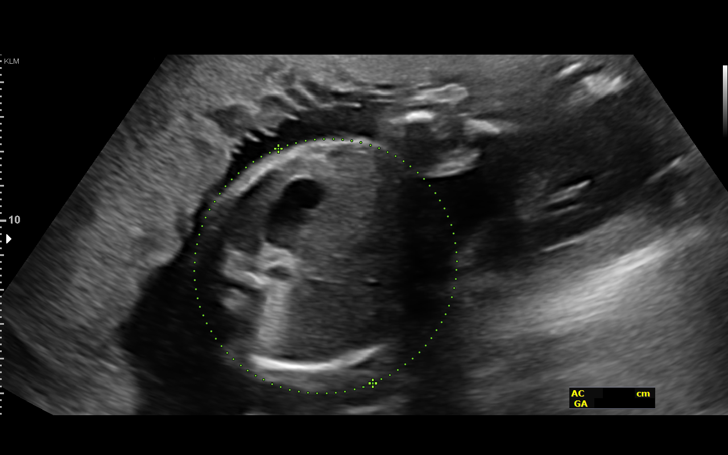
[im 20/42]
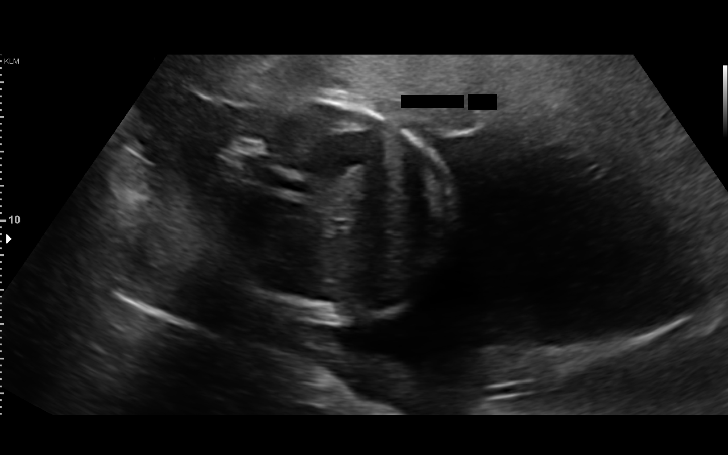
[im 23/42]
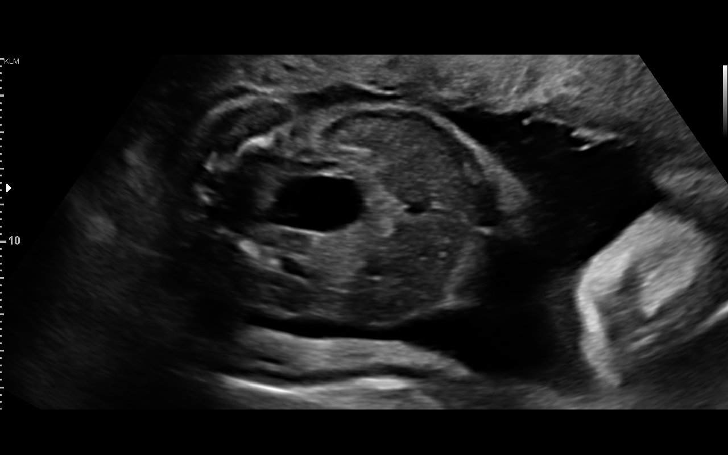
[im 26/42]
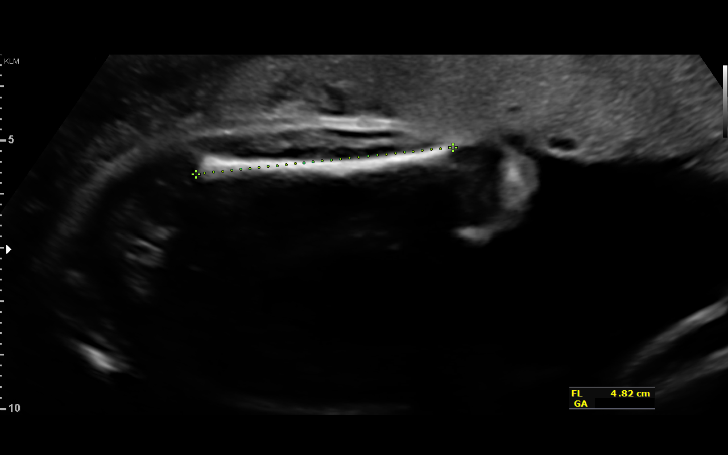
[im 29/42]
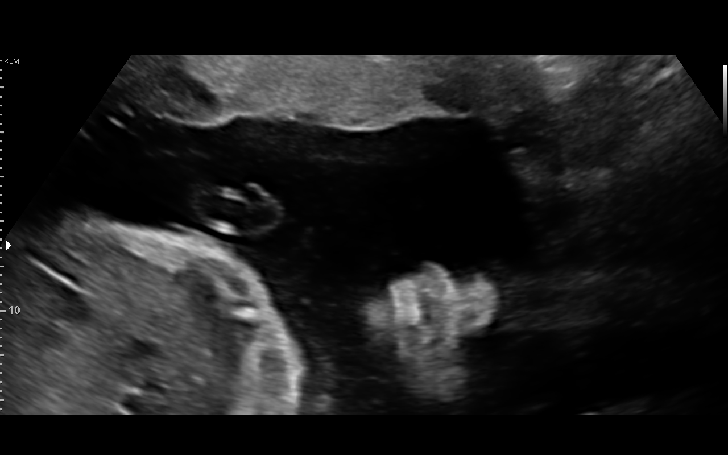
[im 32/42]
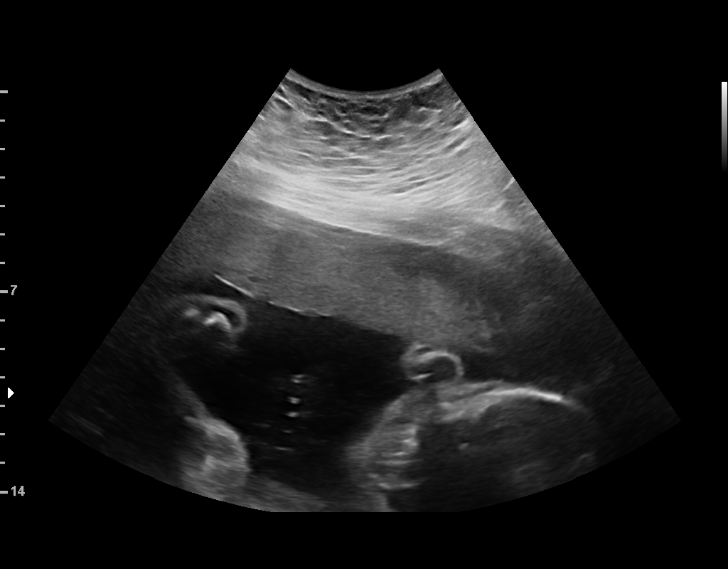
[im 35/42]
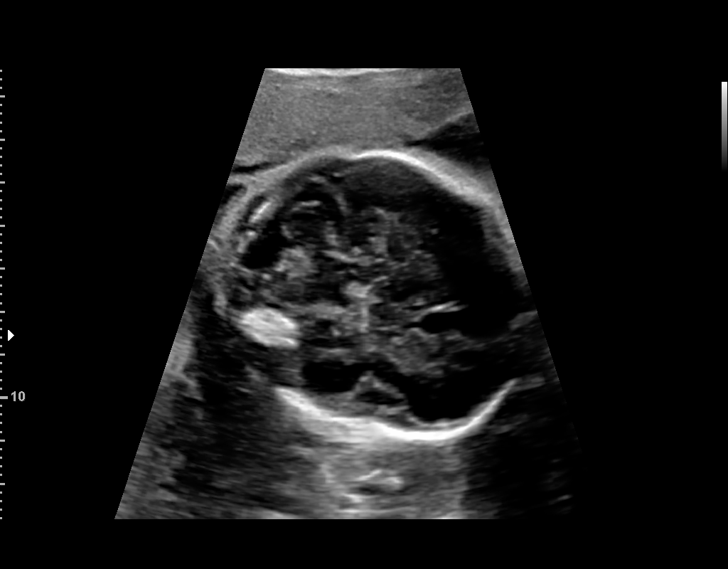
[im 38/42]
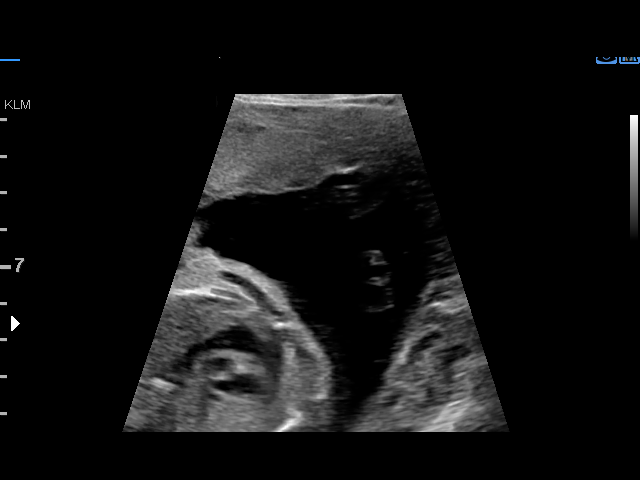
[im 42/42]
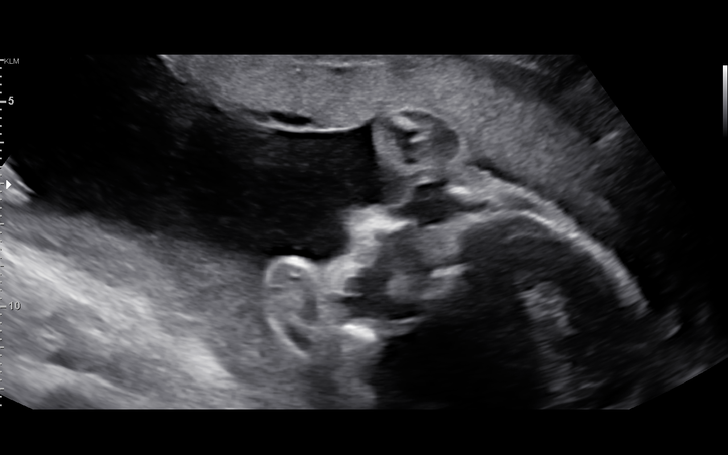

[14 of 28 positions shown; findings below may reference images not displayed]

FINDINGS: 1. Single intrauterine pregnancy.
2. Estimated fetal weight is in the 70th%.
3. Anterior placenta without evidence of previa.
4. Normal amniotic fluid volume.
5. Normal transabdominal cervical length.
6. Again seen is an echogenic focus in the left ventricle.
7. The remainder of the limited anatomy survey is normal;
any anatomy not evaluated on today's exam was evaluated
on the previous exam.
Recommendations

1. Appropriate fetal growth.
2. Fetal anatomic survey is complete.
3. Echogenic focus in the left ventricle:
- previously counseled
- normal quad screen
4. Hypertension:
- on labetalol
- recommend fetal growth every 4 weeks
- recommend start antenatal testing at 32 weeks
- recommend close surveillance for the development of
signs/symptoms of preeclampsia

Patient's blood pressures were 143-152/82-92: patient
asymptomatic. Discussed with Dr. Xeyam Rus send patient to
[facility] for serial BPs and labs.

## 2020-09-04 ENCOUNTER — Other Ambulatory Visit: Payer: Self-pay

## 2020-09-04 ENCOUNTER — Emergency Department (HOSPITAL_COMMUNITY): Payer: Medicaid Other

## 2020-09-04 ENCOUNTER — Encounter (HOSPITAL_COMMUNITY): Payer: Self-pay | Admitting: Emergency Medicine

## 2020-09-04 ENCOUNTER — Emergency Department (HOSPITAL_COMMUNITY)
Admission: EM | Admit: 2020-09-04 | Discharge: 2020-09-04 | Disposition: A | Discharge status: Home / Self Care | Payer: Medicaid Other | Attending: Emergency Medicine | Admitting: Emergency Medicine

## 2020-09-04 DIAGNOSIS — Z79899 Other long term (current) drug therapy: Secondary | ICD-10-CM | POA: Diagnosis not present

## 2020-09-04 DIAGNOSIS — E871 Hypo-osmolality and hyponatremia: Secondary | ICD-10-CM | POA: Insufficient documentation

## 2020-09-04 DIAGNOSIS — Z87891 Personal history of nicotine dependence: Secondary | ICD-10-CM | POA: Diagnosis not present

## 2020-09-04 DIAGNOSIS — I10 Essential (primary) hypertension: Secondary | ICD-10-CM | POA: Diagnosis not present

## 2020-09-04 DIAGNOSIS — R030 Elevated blood-pressure reading, without diagnosis of hypertension: Secondary | ICD-10-CM | POA: Diagnosis present

## 2020-09-04 LAB — URINALYSIS, ROUTINE W REFLEX MICROSCOPIC
Bilirubin Urine: NEGATIVE
Glucose, UA: NEGATIVE mg/dL
Hgb urine dipstick: NEGATIVE
Ketones, ur: NEGATIVE mg/dL
Leukocytes,Ua: NEGATIVE
Nitrite: NEGATIVE
Protein, ur: NEGATIVE mg/dL
Specific Gravity, Urine: 1.002 — ABNORMAL LOW (ref 1.005–1.030)
pH: 6 (ref 5.0–8.0)

## 2020-09-04 LAB — CBC
HCT: 40.6 % (ref 36.0–46.0)
Hemoglobin: 13.5 g/dL (ref 12.0–15.0)
MCH: 31.6 pg (ref 26.0–34.0)
MCHC: 33.3 g/dL (ref 30.0–36.0)
MCV: 95.1 fL (ref 80.0–100.0)
Platelets: 293 10*3/uL (ref 150–400)
RBC: 4.27 MIL/uL (ref 3.87–5.11)
RDW: 13.8 % (ref 11.5–15.5)
WBC: 8.9 10*3/uL (ref 4.0–10.5)
nRBC: 0 % (ref 0.0–0.2)

## 2020-09-04 LAB — BASIC METABOLIC PANEL
Anion gap: 9 (ref 5–15)
BUN: 8 mg/dL (ref 6–20)
CO2: 24 mmol/L (ref 22–32)
Calcium: 9.2 mg/dL (ref 8.9–10.3)
Chloride: 97 mmol/L — ABNORMAL LOW (ref 98–111)
Creatinine, Ser: 0.89 mg/dL (ref 0.44–1.00)
GFR, Estimated: >60 mL/min (ref >60)
Glucose, Bld: 136 mg/dL — ABNORMAL HIGH (ref 70–99)
Potassium: 3.9 mmol/L (ref 3.5–5.1)
Sodium: 130 mmol/L — ABNORMAL LOW (ref 135–145)

## 2020-09-04 LAB — PREGNANCY, URINE: Preg Test, Ur: NEGATIVE

## 2020-09-04 NOTE — Discharge Instructions (Addendum)
You were evaluated in the Emergency Department and after careful evaluation, we did not find any emergent condition requiring admission or further testing in the hospital.  Your labs today were overall reasuring, your sodium was a little bit low, I would encourage that you increase the amount of water that you drink daily. Your chest xray showed a slightly enlarged heart, though this is not emergent, I do recommend you follow up with a primary care doctor about this and management of your blood pressure. Please take your prescribed medicine as directed.    Please return to the Emergency Department if you experience any worsening of your condition.  We encourage you to follow up with a primary care provider.  Thank you for allowing [place] to be a part of your care.

## 2020-09-04 NOTE — ED Triage Notes (Addendum)
Pt reports nosebleed on Thursday and Friday.  Went to [facility] yesterday and BP elevated.  Prescribed BP medication and took 1 dose yesterday around noon.  BP elevated this morning. Denies any other symptoms.

## 2020-09-04 NOTE — ED Provider Notes (Addendum)
[facility] EMERGENCY DEPARTMENT Provider Note   CSN: [id number] Arrival date & time: 09/04/20  8841     History No chief complaint on file.   Sarah Brewer is a 30 y.o. female.  HPI 30 year old female with a history of anemia and hypertension presents to the ER with complaints 2 nosebleeds in the last several days and high blood pressure.  Patient states that she had a nosebleed on Thursday which she was able to get under control.  She then again had another nosebleed on Friday and that prompted her to go to urgent care.  She states that there her blood pressure was elevated and she was started on Lisinopril/HCTZ 20/25.  She took 1 dose yesterday, checked her blood pressure again this morning and her blood pressure was elevated again today at 198/132.  She was told by the urgent care provider that if she had another nosebleed, or had any other symptoms that she needs to come to the ER.  She denies any headache, vision changes, nausea, vomiting, chest pain, shortness of breath, back pain, syncope.  She reports history of hypertension and pregnancy and history of hypertension on both sides of her family.    Past Medical History:  Diagnosis Date   Anemia    Hypertension     Patient Active Problem List   Diagnosis Date Noted   Chronic hypertension in pregnancy 01/17/2017    Past Surgical History:  Procedure Laterality Date   INDUCED ABORTION     WISDOM TOOTH EXTRACTION       OB History     Gravida  2   Para  1   Term  1   Preterm      AB  1   Living  1      SAB      IAB  1   Ectopic      Multiple  0   Live Births  1           Family History  Problem Relation Age of Onset   Lung cancer Maternal Grandmother    Breast cancer Maternal Grandmother    Heart disease Paternal Grandmother    Kidney disease Paternal Grandmother    Heart disease Paternal Grandfather     Social History   Tobacco Use   Smoking status: Former   Smokeless  tobacco: Never  Substance Use Topics   Alcohol use: Yes    Comment: occ   Drug use: Yes    Types: Marijuana    Comment: quit with preg, last used August 2018    Home Medications Prior to Admission medications   Medication Sig Start Date End Date Taking? Authorizing Provider  ibuprofen (ADVIL,MOTRIN) 600 MG tablet Take 1 tablet (600 mg total) by mouth every 6 (six) hours. 01/19/17   Myna Hidalgo, DO  labetalol (NORMODYNE) 100 MG tablet Take 2 tablets (200 mg total) by mouth every 12 (twelve) hours. Patient not taking: Reported on 01/17/2017 10/26/16   Aviva Signs, CNM  NIFEdipine (PROCARDIA-XL/ADALAT-CC/NIFEDICAL-XL) 30 MG 24 hr tablet TK 1 T PO QD 11/02/16   [provider]  Prenatal Vit-Fe Fumarate-FA (PRENATAL VITAMIN PO) Take by mouth.    [provider]    Allergies    Penicillins  Review of Systems   Review of Systems Ten systems reviewed and are negative for acute change, except as noted in the HPI.   Physical Exam Updated Vital Signs BP (!) 179/104   Pulse 93  Temp 97.7 F (36.5 C) (Oral)   Resp 18   SpO2 100%   Physical Exam Vitals and nursing note reviewed.  Constitutional:      General: She is not in acute distress.    Appearance: She is well-developed. She is not ill-appearing or diaphoretic.  HENT:     Head: Normocephalic and atraumatic.     Nose:     Comments: No evidence of nosebleed, no evidence of retained blood products Eyes:     Conjunctiva/sclera: Conjunctivae normal.  Cardiovascular:     Rate and Rhythm: Normal rate and regular rhythm.     Heart sounds: No murmur heard. Pulmonary:     Effort: Pulmonary effort is normal. No respiratory distress.     Breath sounds: Normal breath sounds.  Abdominal:     Palpations: Abdomen is soft.     Tenderness: There is no abdominal tenderness.  Musculoskeletal:        General: Normal range of motion.     Cervical back: Neck supple.  Skin:    General: Skin is warm and dry.   Neurological:     Mental Status: She is alert.     Sensory: No sensory deficit.     Motor: No weakness.     Comments: Mental Status:  Alert, thought content appropriate, able to give a coherent history. Speech fluent without evidence of aphasia. Able to follow 2 step commands without difficulty.  Cranial Nerves:  II:  Peripheral visual fields grossly normal, pupils equal, round, reactive to light III,IV, VI: ptosis not present, extra-ocular motions intact bilaterally  V,VII: smile symmetric, facial light touch sensation equal VIII: hearing grossly normal to voice  X: uvula elevates symmetrically  XI: bilateral shoulder shrug symmetric and strong XII: midline tongue extension without fassiculations Motor:  Normal tone. 5/5 strength of BUE and BLE major muscle groups including strong and equal grip strength and dorsiflexion/plantar flexion Sensory: light touch normal in all extremities. Cerebellar: normal finger-to-nose with bilateral upper extremities, Romberg sign absent Gait: normal gait and balance. Able to walk on toes and heels with ease.       ED Results / Procedures / Treatments   Labs (all labs ordered are listed, but only abnormal results are displayed) Labs Reviewed  BASIC METABOLIC PANEL - Abnormal; Notable for the following components:      Result Value   Sodium 130 (*)    Chloride 97 (*)    Glucose, Bld 136 (*)    All other components within normal limits  URINALYSIS, ROUTINE W REFLEX MICROSCOPIC - Abnormal; Notable for the following components:   Color, Urine STRAW (*)    Specific Gravity, Urine 1.002 (*)    All other components within normal limits  CBC  PREGNANCY, URINE  POC URINE PREG, ED    EKG None  Radiology DG Chest Portable 1 View  Result Date: 09/04/2020 CLINICAL DATA:  High blood pressure and chest pain EXAM: PORTABLE CHEST 1 VIEW COMPARISON:  None. FINDINGS: Borderline heart size but accentuated by portable technique. There is no edema,  consolidation, effusion, or pneumothorax. No osseous findings. Artifact from EKG leads. IMPRESSION: 1. Generous heart size. 2. Negative for edema. Electronically Signed   By: Marnee Spring M.D.   On: 09/04/2020 09:46    Procedures Procedures   Medications Ordered in ED Medications - No data to display  ED Course  I have reviewed the triage vital signs and the nursing notes.  Pertinent labs & imaging results that were available during  my care of the patient were reviewed by me and considered in my medical decision making (see chart for details).    MDM Rules/Calculators/A&P                           30 year old female presented to the ER with complaints of elevated blood pressure.  On arrival, she is well-appearing, no acute distress, resting comfortably in the ER bed.  Blood pressure on arrival was 193/135, however repeat did go down to 179/104 with no intervention.  Other vitals reassuring, afebrile, not tachycardic, tachypneic or hypoxic.  EKG without any ischemic changes.  Overall lab work reassuring, CBC unremarkable, BMP with mild hyponatremia 130, glucose of 136 but normal anion gap.  Pregnancy is negative.  Chest x-ray does show slightly enlarged heart but no evidence of edema.  Low suspicion for intracranial bleed as she has no headache, blurry vision, nausea, vomiting, or any other concerning symptoms.  Low suspicion for hypertensive urgency/emergency, ICH.  I stressed continuing to take her blood pressure medicines and establishment with a PCP for further management.  We discussed return precautions.  She was understanding and is agreeable.  Stable for discharge. Final Clinical Impression(s) / ED Diagnoses Final diagnoses:  Hypertension, unspecified type    Rx / DC Orders ED Discharge Orders     None            Mare Ferrari, PA-C 09/04/20 1033    Virgina Norfolk, DO 09/04/20 1451

## 2021-05-17 ENCOUNTER — Encounter: Payer: Self-pay | Admitting: Physical Therapy

## 2021-05-17 ENCOUNTER — Ambulatory Visit: Payer: Medicaid Other | Attending: Family Medicine | Admitting: Physical Therapy

## 2021-05-17 DIAGNOSIS — M25511 Pain in right shoulder: Secondary | ICD-10-CM

## 2021-05-17 DIAGNOSIS — R293 Abnormal posture: Secondary | ICD-10-CM

## 2021-05-17 DIAGNOSIS — M6281 Muscle weakness (generalized): Secondary | ICD-10-CM | POA: Diagnosis present

## 2021-05-17 NOTE — Addendum Note (Signed)
Addended by: Marcelina Morel on: 05/17/2021 12:18 PM ? ? Modules accepted: Orders ? ?

## 2021-05-17 NOTE — Therapy (Signed)
[place] ?[place] ?[address] ?[place], [place], [address] ?Phone: [phone]   Fax:  [phone] ? ?Physical Therapy Evaluation ? ?Patient Details  ?Name: Sarah Brewer ?MRN: [id number] ?Date of Birth: May 12, 1990 ?Referring Provider (PT): Rip Harbour ? ? ?Encounter Date: 05/17/2021 ? ? PT End of Session - 05/17/21 1203   ? ? Visit Number 1   ? Date for PT Re-Evaluation 07/12/21   ? PT Start Time [phone]   ? PT Stop Time 0926   ? PT Time Calculation (min) 36 min   ? Activity Tolerance Patient tolerated treatment well   ? Behavior During Therapy [facility] for tasks assessed/performed   ? ?  ?  ? ?  ? ? ?Past Medical History:  ?Diagnosis Date  ? Anemia   ? Hypertension   ? ? ?Past Surgical History:  ?Procedure Laterality Date  ? INDUCED ABORTION    ? WISDOM TOOTH EXTRACTION    ? ? ?There were no vitals filed for this visit. ? ? ? Subjective Assessment - 05/17/21 0857   ? ? Subjective Patient reports that she had an old injury in high school. 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 She feels her shoulder is unstable. The Dr did an exam and does not feel there is any injury. She comes for PT to assess the shoulder, treat pain, and strengthen it. Her original injury was a dislocated shoulder while playing basketball. She had PT then, went back to playing iwth a brace. She has not had any additional dislocations, but is protective of it.   ? Limitations Lifting   ? How long can you sit comfortably? N/A   ? How long can you stand comfortably? N/A   ? How long can you walk comfortably? N/A   ? Patient Stated Goals Improve shoulder stability so that it does not feel like it will dislocate, decreased pain after using it.   ? Currently in Pain? No/denies   Pain gets to 6-7 after work. Lying on a back massager helps.  ? ?  ?  ? ?  ? ? ? ? ? [facility] PT Assessment - 05/17/21 0001   ? ?  ? Assessment  ? Medical Diagnosis R post shoulder pain   ? Referring Provider (PT) Rip Harbour   ?  ?  Home Environment  ? Additional Comments No issues with self care   ?  ? Prior Function  ? Level of Independence Independent   ? Vocation Full time employment   ? Vocation Requirements waits tables, lots of lifting   ? Leisure Works 3 jobs, walks her dog who pulls on the leash.   ?  ? Cognition  ? Overall Cognitive Status Within Functional Limits for tasks assessed   ?  ? Sensation  ? Additional Comments When she wakes up in the morning she sometimes has tingling, probably due to her sleeping postion.   ?  ? Posture/Postural Control  ? Posture Comments slightly forward head and rounded shouders   ?  ? ROM / Strength  ? AROM / PROM / Strength AROM;Strength   ?  ? AROM  ? Overall AROM Comments AROM WFL for B shoulders, although patient is cautious iwth R shoulder elevation due to fear of dislocation. It feels unstable.   ?  ? Strength  ? Overall Strength Comments B shoulder and UE strength appears at least 4/5. However, patient reports fear of L shoulder giving out with resistance.   ?  ?  Palpation  ? Palpation comment Patient with no reports of TTP. R shoulder joint mobs show increased range compared to L, with apparent laxity.   ?  ? Special Tests  ?  Special Tests Rotator Cuff Impingement   ? Rotator Cuff Impingment tests Michel Bickers test   ?  ? Hawkins-Kennedy test  ? Findings Negative   ? Side Right   ? Comments No TTP over suprasinatous tendon.   ? ?  ?  ? ?  ? ? ? ? ? ? ? ? ? ? ? ? ? ?Objective measurements completed on examination: See above findings.  ? ? ? ? ? ? ? ? ? ? ? ? ? ? PT Education - 05/17/21 1154   ? ? Education Details POC   ? Person(s) Educated Patient   ? Methods Explanation   ? Comprehension Verbalized understanding   ? ?  ?  ? ?  ? ? ? PT Short Term Goals - 05/17/21 1210   ? ?  ? PT SHORT TERM GOAL #1  ? Title I with initial HEP.   ? Time 4   ? Period Weeks   ? Status New   ? ?  ?  ? ?  ? ? ? ? PT Long Term Goals - 05/17/21 1211   ? ?  ? PT LONG TERM GOAL #1  ? Title I with final HEP   ?  Time 8   ? Period Weeks   ? Status New   ?  ? PT LONG TERM GOAL #2  ? Title Patient will report shoulder pain of </=3/10 after a shift at work.   ? Baseline Up to 6/10   ? Time 8   ? Period Weeks   ? Status New   ?  ? PT LONG TERM GOAL #3  ? Title Patient will be able to lift at least 5# overhead without fear of R shoulder disllocation.   ? Baseline Fearful of any resisted shoulder flexion > 90.   ? Time 8   ? Period Weeks   ? Status New   ?  ? PT LONG TERM GOAL #4  ? Title Patient will demosntrate imporved upriht posture with reduced forward head and reduced posioitn of rounded shoulders to demosntrate improved scapular position and support.   ? Time 8   ? Period Weeks   ? Status New   ? ?  ?  ? ?  ? ? ? ? ? ? ? ? ? Plan - 05/17/21 1204   ? ? Clinical Impression Statement Patient reports prior R shoulder dislocation in high school. 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 She also feels fear of dislocation of the L shoulder as it feels loose. Evaluation demosntrates joint laxity, functional weakness in scapular stability, tight pect muscles. she will benefit from PT to establish a HEP and strenghten her scapular stabilizers, stretch ant chest muscles, strengthen postural muscles to improve joint stability and confidence her r shoulder will not dislocate again or cause pain.   ? Personal Factors and Comorbidities Fitness;Past/Current Experience   ? Examination-Activity Limitations Reach Overhead;Carry;Lift   ? Examination-Participation Restrictions Occupation   ? Stability/Clinical Decision Making Stable/Uncomplicated   ? Clinical Decision Making Moderate   ? Rehab Potential Good   ? PT Frequency Other (comment)   Start with 2x/week, decrease to 1x/week.  ? PT Duration 8 weeks   ? PT Treatment/Interventions ADLs/Self Care Home Management;Iontophoresis  4mg /ml Dexamethasone;Neuromuscular re-education;Manual techniques;Functional mobility training;Therapeutic  activities;Therapeutic exercise;Moist Heat;Patient/family education;Passive range of motion;Dry needling   ? PT Next Visit Plan Initiated HEP for scapular and upper trunk stability, ant chest stretch.   ? Consulted and Agree with Plan of Care Patient   ? ?  ?  ? ?  ? ? ?Patient will benefit from skilled therapeutic intervention in order to improve the following deficits and impairments:  Impaired UE functional use, Pain, Improper body mechanics, Decreased strength, Postural dysfunction ? ?Visit Diagnosis: ?Abnormal posture ? ?Muscle weakness (generalized) ? ?Acute pain of right shoulder ? ? ? ? ?Problem List ?Patient Active Problem List  ? Diagnosis Date Noted  ? Chronic hypertension in pregnancy 01/17/2017  ? ? ?Marcelina Morel, DPT ?05/17/2021, 12:16 PM ? ?[place] ?[place] ?[address] ?[place], [place], [address] ?Phone: [phone]   Fax:  [phone] ? ?Name: Hasna Taff ?MRN: [id number] ?Date of Birth: 07-15-1990 ? ? ?

## 2021-05-20 ENCOUNTER — Ambulatory Visit: Payer: Medicaid Other | Admitting: Physical Therapy

## 2021-05-20 ENCOUNTER — Encounter: Payer: Self-pay | Admitting: Physical Therapy

## 2021-05-20 DIAGNOSIS — R293 Abnormal posture: Secondary | ICD-10-CM | POA: Diagnosis not present

## 2021-05-20 DIAGNOSIS — M6281 Muscle weakness (generalized): Secondary | ICD-10-CM

## 2021-05-20 DIAGNOSIS — M25511 Pain in right shoulder: Secondary | ICD-10-CM

## 2021-05-20 NOTE — Therapy (Signed)
[place] ?[place] ?[address] ?[place], [place], [address] ?Phone: [phone]   Fax:  [phone] ? ?Physical Therapy Treatment ? ?Patient Details  ?Name: Sarah Brewer ?MRN: [id number] ?Date of Birth: 14-Oct-1990 ?Referring Provider (PT): Rip Harbour ? ? ?Encounter Date: 05/20/2021 ? ? PT End of Session - 05/20/21 1142   ? ? Visit Number 2   ? Date for PT Re-Evaluation 07/12/21   ? PT Start Time 1103   ? PT Stop Time [id number]   ? PT Time Calculation (min) 39 min   ? Activity Tolerance Patient tolerated treatment well   ? Behavior During Therapy [facility] for tasks assessed/performed   ? ?  ?  ? ?  ? ? ?Past Medical History:  ?Diagnosis Date  ? Anemia   ? Hypertension   ? ? ?Past Surgical History:  ?Procedure Laterality Date  ? INDUCED ABORTION    ? WISDOM TOOTH EXTRACTION    ? ? ?There were no vitals filed for this visit. ? ? Subjective Assessment - 05/20/21 1104   ? ? 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 My pain tends to be in my back in my shoulder blade, pain in R shoulder tends to be unsteady and unstable and general. When my shoulder dislocated the ortho said that something else tore in my shoulder. Dislocation was posterior.   ? Patient Stated Goals Improve shoulder stability so that it does not feel like it will dislocate, decreased pain after using it.   ? Currently in Pain? No/denies   ? ?  ?  ? ?  ? ? ? ? ? ? ? ? ? ? ? ? ? ? ? ? ? ? ? ? [facility] Adult PT Treatment/Exercise - 05/20/21 0001   ? ?  ? Exercises  ? Exercises Shoulder   ?  ? Shoulder Exercises: Prone  ? Other Prone Exercises blackburn 6 0# 1x10   ? Other Prone Exercises shoulder stability flexion to 90 degrees 1x15, then 3 way shoulder stability on wall 1x10 B   ?  ? Shoulder Exercises: Body Blade  ? Flexion 2 reps;30 seconds   ? Flexion Limitations one with elbow bent, one with arm straight   ? Other Body Blade Exercises vertical  side to side x30 seconds elbow bent, then 30 seconds with elbow straight; arm out ot the side horizontal 2x30 seconds   ? Other Body Blade Exercises OH forward/backward shakes with body blade 2x30 seconds; active flexion with body blade horizontal shakes x10   ? ?  ?  ? ?  ? ? ? ? ? ? ? ? ? ? PT Education - 05/20/21 1142   ? ? Education Details HEP   ? Person(s) Educated Patient   ? Methods Explanation   ? Comprehension Verbalized understanding   ? ?  ?  ? ?  ? ? ? PT Short Term Goals - 05/17/21 1210   ? ?  ? PT SHORT TERM GOAL #1  ? Title I with initial HEP.   ? Time 4   ? Period Weeks   ? Status New   ? ?  ?  ? ?  ? ? ? ? PT Long Term Goals - 05/17/21 1211   ? ?  ? PT LONG TERM GOAL #1  ? Title I with final HEP   ? Time 8   ? Period  Weeks   ? Status New   ?  ? PT LONG TERM GOAL #2  ? Title Patient will report shoulder pain of </=3/10 after a shift at work.   ? Baseline Up to 6/10   ? Time 8   ? Period Weeks   ? Status New   ?  ? PT LONG TERM GOAL #3  ? Title Patient will be able to lift at least 5# overhead without fear of R shoulder disllocation.   ? Baseline Fearful of any resisted shoulder flexion > 90.   ? Time 8   ? Period Weeks   ? Status New   ?  ? PT LONG TERM GOAL #4  ? Title Patient will demosntrate imporved upriht posture with reduced forward head and reduced posioitn of rounded shoulders to demosntrate improved scapular position and support.   ? Time 8   ? Period Weeks   ? Status New   ? ?  ?  ? ?  ? ? ? ? ? ? ? ? Plan - 05/20/21 1142   ? ? Clinical Impression Statement Eslie arrives today doing well, still having a lot of pain and instability in her right shoulder. She tells me that the original dislocation was actually posterior. We warmed up on the arm bike then focused on strengthening musculature around her shoulders especially on stabilizing groups. Did well today, just generally very weak especially in overhead positions.   ? Personal Factors and Comorbidities Fitness;Past/Current Experience   ?  Examination-Activity Limitations Reach Overhead;Carry;Lift   ? Examination-Participation Restrictions Occupation   ? Stability/Clinical Decision Making Stable/Uncomplicated   ? Clinical Decision Making Moderate   ? Rehab Potential Good   ? PT Frequency Other (comment)   ? PT Duration 8 weeks   ? PT Treatment/Interventions ADLs/Self Care Home Management;Iontophoresis 4mg /ml Dexamethasone;Neuromuscular re-education;Manual techniques;Functional mobility training;Therapeutic activities;Therapeutic exercise;Moist Heat;Patient/family education;Passive range of motion;Dry needling   ? PT Next Visit Plan Initiated HEP for scapular and upper trunk stability, ant chest stretch.   ? Consulted and Agree with Plan of Care Patient   ? ?  ?  ? ?  ? ? ?Patient will benefit from skilled therapeutic intervention in order to improve the following deficits and impairments:  Impaired UE functional use, Pain, Improper body mechanics, Decreased strength, Postural dysfunction ? ?Visit Diagnosis: ?Abnormal posture ? ?Muscle weakness (generalized) ? ?Acute pain of right shoulder ? ? ? ? ?Problem List ?Patient Active Problem List  ? Diagnosis Date Noted  ? Chronic hypertension in pregnancy 01/17/2017  ? ?Marycruz Boehner U PT, DPT, PN2  ? ?Supplemental Physical Therapist ?[place]  ? ? ? ? ? ?[place] ?[address] ?[address] ?[place], [place], [address] ?Phone: [phone]   Fax:  [phone] ? ?Name: Jobana Gruszka ?MRN: [id number] ?Date of Birth: Aug 29, 1990 ? ? ? ?

## 2021-05-26 ENCOUNTER — Ambulatory Visit: Payer: Medicaid Other | Admitting: Physical Therapy

## 2021-05-26 ENCOUNTER — Encounter: Payer: Self-pay | Admitting: Physical Therapy

## 2021-05-26 DIAGNOSIS — M25511 Pain in right shoulder: Secondary | ICD-10-CM

## 2021-05-26 DIAGNOSIS — R293 Abnormal posture: Secondary | ICD-10-CM

## 2021-05-26 DIAGNOSIS — M6281 Muscle weakness (generalized): Secondary | ICD-10-CM

## 2021-05-26 NOTE — Therapy (Signed)
[place] ?[facility] ?[address] ?[place], [place], [address] ?Phone: [phone]   Fax:  [phone] ? ?Physical Therapy Treatment ? ?Patient Details  ?Name: Sarah Brewer ?MRN: [id number] ?Date of Birth: December 07, 1990 ?Referring Provider (PT): Althea Charon ? ? ?Encounter Date: 05/26/2021 ? ? PT End of Session - 05/26/21 0930   ? ? Visit Number 3   ? Date for PT Re-Evaluation 07/12/21   ? PT Start Time 0848   ? PT Stop Time 0927   ? PT Time Calculation (min) 39 min   ? Activity Tolerance Patient tolerated treatment well   ? Behavior During Therapy [facility] for tasks assessed/performed   ? ?  ?  ? ?  ? ? ?Past Medical History:  ?Diagnosis Date  ? Anemia   ? Hypertension   ? ? ?Past Surgical History:  ?Procedure Laterality Date  ? INDUCED ABORTION    ? WISDOM TOOTH EXTRACTION    ? ? ?There were no vitals filed for this visit. ? ? Subjective Assessment - 05/26/21 0853   ? ? Subjective Patient reports no changes. she saw her Dr on Monday. He recommends continuing with PT. She reports that her R shoulder definitely feels weaker than L while performing her exercises.   ? Currently in Pain? No/denies   Patient reports pain up to 7/10 in shoulder after working.  ? ?  ?  ? ?  ? ? ? ? ? ? ? ? ? ? ? ? ? ? ? ? ? ? ? ? [facility] Adult PT Treatment/Exercise - 05/26/21 0001   ? ?  ? Shoulder Exercises: Standing  ? Other Standing Exercises Overhead shoulder flexion with 5# weight facing wall. 2 x 10 reps.   ?  ? Shoulder Exercises: ROM/Strengthening  ? UBE (Upper Arm Bike) L1.5, 3 min forward, 3 min back   ?  ? Shoulder Exercises: Stretch  ? Other Shoulder Stretches Doorway stretch at 60, 90, 120, 30 seconds each   ?  ? Shoulder Exercises: Power Tower  ? External Rotation 10 reps   ? External Rotation Limitations 10# B   ? Internal Rotation 10 reps   ? Internal Rotation Limitations 10# B   ? ?  ?  ? ?  ? ? ? ? ? ? ? ? ? ? ? ? PT Short Term Goals - 05/26/21 0903   ? ?  ? PT SHORT TERM GOAL #1  ? Title I with  initial HEP.   ? Time 4   ? Period Weeks   ? Status Achieved   ? ?  ?  ? ?  ? ? ? ? PT Long Term Goals - 05/17/21 1211   ? ?  ? PT LONG TERM GOAL #1  ? Title I with final HEP   ? Time 8   ? Period Weeks   ? Status New   ?  ? PT LONG TERM GOAL #2  ? Title Patient will report shoulder pain of </=3/10 after a shift at work.   ? Baseline Up to 6/10   ? Time 8   ? Period Weeks   ? Status New   ?  ? PT LONG TERM GOAL #3  ? Title Patient will be able to lift at least 5# overhead without fear of R shoulder disllocation.   ? Baseline Fearful of any resisted shoulder flexion > 90.   ? Time 8   ? Period Weeks   ? Status New   ?  ?  PT LONG TERM GOAL #4  ? Title Patient will demosntrate imporved upriht posture with reduced forward head and reduced posioitn of rounded shoulders to demosntrate improved scapular position and support.   ? Time 8   ? Period Weeks   ? Status New   ? ?  ?  ? ?  ? ? ? ? ? ? ? ? Plan - 05/26/21 0908   ? ? Clinical Impression Statement Patient reports no changes. Her Dr wants to give PT/strengthening a chance to give her more stabilty. Continued to emphasize her shoulder strength and stability with exercises today, updating HEP.   ? Personal Factors and Comorbidities Fitness;Past/Current Experience   ? Examination-Activity Limitations Reach Overhead;Carry;Lift   ? Examination-Participation Restrictions Occupation   ? Stability/Clinical Decision Making Stable/Uncomplicated   ? Clinical Decision Making Moderate   ? Rehab Potential Good   ? PT Frequency 1x / week   ? PT Duration 6 weeks   ? PT Treatment/Interventions ADLs/Self Care Home Management;Iontophoresis 4mg /ml Dexamethasone;Neuromuscular re-education;Manual techniques;Functional mobility training;Therapeutic activities;Therapeutic exercise;Moist Heat;Patient/family education;Passive range of motion;Dry needling   ? PT Next Visit Plan Assess tolerance to HEP   ? PT Home Exercise Plan Access Code: [email or web address]  URL: [email or web address]   Date: 05/26/2021  Prepared by: 05/28/2021    Exercises  - Shoulder Extension with Resistance  - 1 x daily - 7 x weekly - 2 sets - 10 reps  - Standing Row with Anchored Resistance  - 1 x daily - 7 x weekly - 2 sets - 10 reps  - Shoulder External Rotation and Scapular Retraction with Resistance  - 1 x daily - 7 x weekly - 2 sets - 10 reps  - Doorway Pec Stretch at 90 Degrees Abduction  - 1 x daily - 7 x weekly - 3 reps - 10 hold  - Doorway Pec Stretch at 120 Degrees Abduction  - 1 x daily - 7 x weekly - 3 reps - 10 hold  - Doorway Pec Stretch at 60 Elevation  - 1 x daily - 7 x weekly - 3 reps - 10 hold   ? Consulted and Agree with Plan of Care Patient   ? ?  ?  ? ?  ? ? ?Patient will benefit from skilled therapeutic intervention in order to improve the following deficits and impairments:  Impaired UE functional use, Pain, Improper body mechanics, Decreased strength, Postural dysfunction ? ?Visit Diagnosis: ?Abnormal posture ? ?Muscle weakness (generalized) ? ?Acute pain of right shoulder ? ? ? ? ?Problem List ?Patient Active Problem List  ? Diagnosis Date Noted  ? Chronic hypertension in pregnancy 01/17/2017  ? ? ?01/19/2017, DPT ?05/26/2021, 9:31 AM ? ?[place] ?[facility] ?[address] ?[place], [place], [place] ?Phone: [phone]   Fax:  [phone] ? ?Name: Sarah Brewer ?MRN: [name] ?Date of Birth: 1990/09/07 ? ? ? ?

## 2021-05-27 ENCOUNTER — Encounter: Payer: Self-pay | Admitting: Physical Therapy

## 2021-05-27 ENCOUNTER — Ambulatory Visit: Payer: Medicaid Other | Admitting: Physical Therapy

## 2021-05-27 DIAGNOSIS — M6281 Muscle weakness (generalized): Secondary | ICD-10-CM

## 2021-05-27 DIAGNOSIS — M25511 Pain in right shoulder: Secondary | ICD-10-CM

## 2021-05-27 DIAGNOSIS — R293 Abnormal posture: Secondary | ICD-10-CM

## 2021-05-27 NOTE — Therapy (Cosign Needed)
[place] ?[facility] ?[address] ?[place], [place], [address] ?Phone: [phone]   Fax:  [phone] ? ?Physical Therapy Treatment ? ?Patient Details  ?Name: Sarah Brewer ?MRN: [id number] ?Date of Birth: 02-02-1990 ?Referring Provider (PT): Althea Charon ? ? ?Encounter Date: 05/27/2021 ? ? PT End of Session - 05/27/21 1058   ? ? Visit Number 4   ? Date for PT Re-Evaluation 07/12/21   ? PT Start Time 1019   patient was 4 mins late  ? PT Stop Time 1057   ? PT Time Calculation (min) 38 min   ? Activity Tolerance Patient tolerated treatment well   ? Behavior During Therapy [facility] for tasks assessed/performed   ? ?  ?  ? ?  ? ? ?Past Medical History:  ?Diagnosis Date  ? Anemia   ? Hypertension   ? ? ?Past Surgical History:  ?Procedure Laterality Date  ? INDUCED ABORTION    ? WISDOM TOOTH EXTRACTION    ? ? ?There were no vitals filed for this visit. ? ? Subjective Assessment - 05/27/21 1021   ? ? Subjective Patient states she is doing good. She has trouble with overhead movements.   ? Limitations Lifting   ? Patient Stated Goals Improve shoulder stability so that it does not feel like it will dislocate, decreased pain after using it.   ? Currently in Pain? No/denies   ? ?  ?  ? ?  ? ? ? ? ? ? ? ? ? ? ? ? ? ? ? ? ? ? ? ? [facility] Adult PT Treatment/Exercise - 05/27/21 0001   ? ?  ? Shoulder Exercises: Standing  ? Other Standing Exercises overhead flexion w/ blue weight ball 2x10   ? Other Standing Exercises diagonals up and down 2x10 red TB; horizontal abd 2x10 red TB   ?  ? Shoulder Exercises: ROM/Strengthening  ? UBE (Upper Arm Bike) lvl 3x 6 mins 3 mins fwd and 3 mins backward   ?  ? Shoulder Exercises: Power Tower  ? Extension 20 reps   ? Extension Limitations 5#   ? Other Power Museum/gallery curator rows 2x10  15#   ? Other Power Museum/gallery curator lat pull downs x10 #15; lat pull down x10 20#   ? ?  ?  ? ?  ? ? ? ? ? ? ? ? ? ? ? ? PT Short Term Goals - 05/26/21 0903   ? ?  ? PT SHORT TERM GOAL  #1  ? Title I with initial HEP.   ? Time 4   ? Period Weeks   ? Status Achieved   ? ?  ?  ? ?  ? ? ? ? PT Long Term Goals - 05/27/21 1059   ? ?  ? PT LONG TERM GOAL #1  ? Title I with final HEP   ? Time 8   ? Period Weeks   ? Status On-going   ?  ? PT LONG TERM GOAL #2  ? Title Patient will report shoulder pain of </=3/10 after a shift at work.   ? Time 8   ? Period Weeks   ? Status On-going   ?  ? PT LONG TERM GOAL #3  ? Title Patient will be able to lift at least 5# overhead without fear of R shoulder disllocation.   ? Time 8   ? Period Weeks   ? Status Achieved   ?  ? PT LONG TERM GOAL #4  ?  Title Patient will demosntrate imporved upriht posture with reduced forward head and reduced posioitn of rounded shoulders to demosntrate improved scapular position and support.   ? Time 8   ? Period Weeks   ? Status On-going   ? ?  ?  ? ?  ? ? ? ? ? ? ? ? Plan - 05/27/21 1100   ? ? Clinical Impression Statement Patient came in doing well. She stated she is still having trouble with overhead movements. Educated patients about different shoulder stretches. Focused on strengthening for today's session. VC's needed for correct posture during lat pull downs and diagonals.   ? Personal Factors and Comorbidities Fitness;Past/Current Experience   ? Examination-Activity Limitations Reach Overhead;Carry;Lift   ? Examination-Participation Restrictions Occupation   ? Stability/Clinical Decision Making Stable/Uncomplicated   ? Clinical Decision Making Moderate   ? Rehab Potential Good   ? PT Frequency 1x / week   ? PT Duration 6 weeks   ? PT Treatment/Interventions ADLs/Self Care Home Management;Iontophoresis 4mg /ml Dexamethasone;Neuromuscular re-education;Manual techniques;Functional mobility training;Therapeutic activities;Therapeutic exercise;Moist Heat;Patient/family education;Passive range of motion;Dry needling   ? PT Next Visit Plan progress as tolerated   ? PT Home Exercise Plan Access Code: [email or web address]  URL:  [email or web address]  Date: 05/26/2021  Prepared by: 05/28/2021    Exercises  - Shoulder Extension with Resistance  - 1 x daily - 7 x weekly - 2 sets - 10 reps  - Standing Row with Anchored Resistance  - 1 x daily - 7 x weekly - 2 sets - 10 reps  - Shoulder External Rotation and Scapular Retraction with Resistance  - 1 x daily - 7 x weekly - 2 sets - 10 reps  - Doorway Pec Stretch at 90 Degrees Abduction  - 1 x daily - 7 x weekly - 3 reps - 10 hold  - Doorway Pec Stretch at 120 Degrees Abduction  - 1 x daily - 7 x weekly - 3 reps - 10 hold  - Doorway Pec Stretch at 60 Elevation  - 1 x daily - 7 x weekly - 3 reps - 10 hold   ? Consulted and Agree with Plan of Care Patient   ? ?  ?  ? ?  ? ? ?Patient will benefit from skilled therapeutic intervention in order to improve the following deficits and impairments:  Impaired UE functional use, Pain, Improper body mechanics, Decreased strength, Postural dysfunction ? ?Visit Diagnosis: ?Abnormal posture ? ?Muscle weakness (generalized) ? ?Acute pain of right shoulder ? ? ? ? ?Problem List ?Patient Active Problem List  ? Diagnosis Date Noted  ? Chronic hypertension in pregnancy 01/17/2017  ? ? ?Saintclair Schroader 01/19/2017 ?05/27/2021, 11:04 AM ? ?[place] ?[facility] ?[address] ?[place], [place], [place] ?Phone: [phone]   Fax:  [phone] ? ?Name: Sarah Brewer ?MRN: [name] ?Date of Birth: 17-Dec-1990 ? ? ? ?

## 2021-05-31 ENCOUNTER — Ambulatory Visit: Payer: Medicaid Other | Attending: Family Medicine | Admitting: Physical Therapy

## 2021-05-31 ENCOUNTER — Encounter: Payer: Self-pay | Admitting: Physical Therapy

## 2021-05-31 DIAGNOSIS — R293 Abnormal posture: Secondary | ICD-10-CM | POA: Diagnosis present

## 2021-05-31 DIAGNOSIS — M6281 Muscle weakness (generalized): Secondary | ICD-10-CM | POA: Diagnosis present

## 2021-05-31 DIAGNOSIS — M25511 Pain in right shoulder: Secondary | ICD-10-CM | POA: Insufficient documentation

## 2021-05-31 NOTE — Therapy (Signed)
[place] [facility] ?[place], [place], [address] ?Phone: [phone]   Fax:  [phone] ? ?Physical Therapy Treatment ? ?Patient Details  ?Name: Sarah Brewer ?MRN: [id number] ?Date of Birth: 1990-07-22 ?Referring Provider (PT): Sarah Brewer ? ? ?Encounter Date: 05/31/2021 ? ? PT End of Session - 05/31/21 2536   ? ? Visit Number 5   ? Date for PT Re-Evaluation 07/12/21   ? PT Start Time 0845   ? PT Stop Time 0926   ? PT Time Calculation (min) 41 min   ? Activity Tolerance Patient tolerated treatment well   ? Behavior During Therapy [facility] for tasks assessed/performed   ? ?  ?  ? ?  ? ? ?Past Medical History:  ?Diagnosis Date  ? Anemia   ? Hypertension   ? ? ?Past Surgical History:  ?Procedure Laterality Date  ? INDUCED ABORTION    ? WISDOM TOOTH EXTRACTION    ? ? ?There were no vitals filed for this visit. ? ? Subjective Assessment - 05/31/21 0848   ? ? Subjective "I feel fine" Shoulder usually hurts after doing stuff with it   ? Currently in Pain? No/denies   ? ?  ?  ? ?  ? ? ? ? ? ? ? ? ? ? ? ? ? ? ? ? ? ? ? ? [facility] Adult PT Treatment/Exercise - 05/31/21 0001   ? ?  ? Shoulder Exercises: Standing  ? External Rotation Strengthening;Both;20 reps;Theraband   ? Theraband Level (Shoulder External Rotation) Level 3 (Len)   ? Flexion Strengthening;Both;20 reps;Weights   ? Shoulder Flexion Weight (lbs) 2   ? ABduction Strengthening;Both;20 reps;Weights   ? Shoulder ABduction Weight (lbs) 2   ? Extension Strengthening;20 reps;Weights;Both   ? Extension Weight (lbs) 10   ? Row Strengthening;Both;20 reps;Weights   ? Row Weight (lbs) 15   ?  ? Shoulder Exercises: ROM/Strengthening  ? UBE (Upper Arm Bike) L3 x 1.5 each   ? Nustep L4 x 5 min   ?  ? Shoulder Exercises: Power Tower  ? Other Power Engineer, water rows & Lats 25lb 2x10   ? ?  ?  ? ?  ? ? ? ? ? ? ? ? ? ? ? ? PT Short Term Goals - 05/26/21 0903   ? ?  ? PT SHORT TERM GOAL #1  ? Title I with initial HEP.   ? Time 4   ?  Period Weeks   ? Status Achieved   ? ?  ?  ? ?  ? ? ? ? PT Long Term Goals - 05/31/21 0922   ? ?  ? PT LONG TERM GOAL #1  ? Title I with final HEP   ? Status Partially Met   ?  ? PT LONG TERM GOAL #2  ? Title Patient will report shoulder pain of </=3/10 after a shift at work.   ? Status Partially Met   ?  ? PT LONG TERM GOAL #3  ? Title Patient will be able to lift at least 5# overhead without fear of R shoulder disllocation.   ? Status Achieved   ? ?  ?  ? ?  ? ? ? ? ? ? ? ? Plan - 05/31/21 0924   ? ? Clinical Impression Statement Pt enters feeling well. No issues completing today's interventions. Postural cues required with external rotation and seated rows. Cue to engage core with standing shoulder Ext to prevent trunk flexion. No  reports of increase pain, bilateral UE fatigue reported with flexion and abduction.   ? Personal Factors and Comorbidities Fitness;Past/Current Experience   ? Examination-Activity Limitations Reach Overhead;Carry;Lift   ? Examination-Participation Restrictions Occupation   ? Stability/Clinical Decision Making Stable/Uncomplicated   ? Rehab Potential Good   ? PT Frequency 1x / week   ? PT Duration 6 weeks   ? PT Treatment/Interventions ADLs/Self Care Home Management;Iontophoresis 36m/ml Dexamethasone;Neuromuscular re-education;Manual techniques;Functional mobility training;Therapeutic activities;Therapeutic exercise;Moist Heat;Patient/family education;Passive range of motion;Dry needling   ? PT Next Visit Plan progress as tolerated   ? ?  ?  ? ?  ? ? ?Patient will benefit from skilled therapeutic intervention in order to improve the following deficits and impairments:  Impaired UE functional use, Pain, Improper body mechanics, Decreased strength, Postural dysfunction ? ?Visit Diagnosis: ?Abnormal posture ? ?Muscle weakness (generalized) ? ?Acute pain of right shoulder ? ? ? ? ?Problem List ?Patient Active Problem List  ? Diagnosis Date Noted  ? Chronic hypertension in pregnancy  01/17/2017  ? ? ?RScot Jun PTA ?05/31/2021, 9:26 AM ? ?[place] [address] ?[place] [address]?Phone: [phone]  Fax:  [phone]? ?Name: Sarah Brewer?MRN: [id number]?Date of Birth: [birth year]? ? ? ?

## 2021-06-07 ENCOUNTER — Encounter: Payer: Self-pay | Admitting: Physical Therapy

## 2021-06-07 ENCOUNTER — Ambulatory Visit: Payer: Medicaid Other | Admitting: Physical Therapy

## 2021-06-07 DIAGNOSIS — M6281 Muscle weakness (generalized): Secondary | ICD-10-CM

## 2021-06-07 DIAGNOSIS — R293 Abnormal posture: Secondary | ICD-10-CM | POA: Diagnosis not present

## 2021-06-07 DIAGNOSIS — M25511 Pain in right shoulder: Secondary | ICD-10-CM

## 2021-06-07 NOTE — Patient Instructions (Signed)
Access Code: [email or web address] ?URL: [email or web address] ?Date: 06/07/2021 ?Prepared by: Oley Balm ? ?Exercises ?- Shoulder Extension with Resistance  - 1 x daily - 7 x weekly - 2 sets - 10 reps ?- Standing Row with Anchored Resistance  - 1 x daily - 7 x weekly - 2 sets - 10 reps ?- Shoulder External Rotation and Scapular Retraction with Resistance  - 1 x daily - 7 x weekly - 2 sets - 10 reps ?- Doorway Pec Stretch at 90 Degrees Abduction  - 1 x daily - 7 x weekly - 3 reps - 10 hold ?- Doorway Pec Stretch at 120 Degrees Abduction  - 1 x daily - 7 x weekly - 3 reps - 10 hold ?- Doorway Pec Stretch at 60 Elevation  - 1 x daily - 7 x weekly - 3 reps - 10 hold ?- Shoulder Flexion Serratus Activation with Resistance  - 1 x daily - 7 x weekly - 2 sets - 10 reps ?- Standing Wall [employer] in Scaption with Mini Swiss Ball  - 1 x daily - 7 x weekly - 2 sets - 10 reps ?- Standing Wall [employer] with [employer]  - 1 x daily - 7 x weekly - 2 sets - 10 reps ?- Prone Scapular Retraction Arms at Side  - 1 x daily - 7 x weekly - 2 sets - 10 reps ?- Prone W Scapular Retraction  - 1 x daily - 7 x weekly - 2 sets - 10 reps ?- Prone Shoulder Flexion  - 1 x daily - 7 x weekly - 2 sets - 10 reps ?

## 2021-06-07 NOTE — Therapy (Signed)
[place] [facility] ?[address] ?[place], [place], [address] ?Phone: [phone]   Fax:  [phone] ? ?Physical Therapy Treatment ? ?Patient Details  ?Name: Sarah Brewer ?MRN: [id number] ?Date of Birth: 06/20/1990 ?Referring Provider (PT): Althea Charon ? ? ?Encounter Date: 06/07/2021 ? ? PT End of Session - 06/07/21 1003   ? ? Visit Number 6   ? Date for PT Re-Evaluation 07/12/21   ? PT Start Time [phone]   ? PT Stop Time 1012   ? PT Time Calculation (min) 41 min   ? Activity Tolerance Patient tolerated treatment well   ? Behavior During Therapy [facility] for tasks assessed/performed   ? ?  ?  ? ?  ? ? ?Past Medical History:  ?Diagnosis Date  ? Anemia   ? Hypertension   ? ? ?Past Surgical History:  ?Procedure Laterality Date  ? INDUCED ABORTION    ? WISDOM TOOTH EXTRACTION    ? ? ?There were no vitals filed for this visit. ? ? Subjective Assessment - 06/07/21 0934   ? ? Subjective Patient reports that her shoulder pain continues to be present, but possibly not as bad as previously.   ? Limitations Lifting   ? How long can you sit comfortably? N/A   ? How long can you stand comfortably? N/A   ? How long can you walk comfortably? N/A   ? Patient Stated Goals Improve shoulder stability so that it does not feel like it will dislocate, decreased pain after using it.   ? Currently in Pain? No/denies   ? ?  ?  ? ?  ? ? ? ? ? ? ? ? ? ? ? ? ? ? ? ? ? ? ? ? [facility] Adult PT Treatment/Exercise - 06/07/21 0001   ? ?  ? Shoulder Exercises: Prone  ? Flexion Strengthening;Both;20 reps   ? Horizontal ABduction 1 Strengthening;Both;20 reps   ? Other Prone Exercises scaption lift 20 reps   ?  ? Shoulder Exercises: Standing  ? Other Standing Exercises shoulder flexion serratus activation with resistance 2 x 10 reps   ? Other Standing Exercises ball press into wall in flex, scaption, and abd, 10 circles forward and 10 back, B.   ?  ? Shoulder Exercises: ROM/Strengthening  ? UBE (Upper Arm Bike) L3 x 1.5 each    ?  ? Shoulder Exercises: Power Tower  ? Other Power Herbalist press, 10#, 15#, 10 reps each, B.   ? ?  ?  ? ?  ? ? ? ? ? ? ? ? ? ? PT Education - 06/07/21 1002   ? ? Education Details Updated HEP   ? Person(s) Educated Patient   ? Methods Explanation;Demonstration;Handout   ? Comprehension Returned demonstration;Verbalized understanding   ? ?  ?  ? ?  ? ? ? PT Short Term Goals - 05/26/21 0903   ? ?  ? PT SHORT TERM GOAL #1  ? Title I with initial HEP.   ? Time 4   ? Period Weeks   ? Status Achieved   ? ?  ?  ? ?  ? ? ? ? PT Long Term Goals - 06/07/21 0942   ? ?  ? PT LONG TERM GOAL #1  ? Title I with final HEP   ? Time 7   ? Period Weeks   ? Status On-going   ?  ? PT LONG TERM GOAL #2  ? Title Patient will report shoulder  pain of </=3/10 after a shift at work.   ? Baseline Up to 6/10   ? Time 1   ? Period Weeks   ? Status On-going   ?  ? PT LONG TERM GOAL #3  ? Title Patient will be able to lift at least 5# overhead without fear of R shoulder disllocation.   ? Status Achieved   ?  ? PT LONG TERM GOAL #4  ? Title Patient will demosntrate imporved upriht posture with reduced forward head and reduced posioitn of rounded shoulders to demosntrate improved scapular position and support.   ? Time 7   ? Period Weeks   ? Status On-going   ? ?  ?  ? ?  ? ? ? ? ? ? ? ? Plan - 06/07/21 0947   ? ? Clinical Impression Statement Patient reports that she feels slightly stronger, still has pain after work, but possibly slightly less. Workout progressed her shoulder stabilization exercises. She tolerated increased resistance, no C/O pain.   ? Personal Factors and Comorbidities Fitness;Past/Current Experience   ? Examination-Activity Limitations Reach Overhead;Carry;Lift   ? Examination-Participation Restrictions Occupation   ? Stability/Clinical Decision Making Stable/Uncomplicated   ? Clinical Decision Making Moderate   ? Rehab Potential Good   ? PT Frequency 1x / week   ? PT Duration Other (comment)   5w  ? PT  Treatment/Interventions ADLs/Self Care Home Management;Iontophoresis 4mg /ml Dexamethasone;Neuromuscular re-education;Manual techniques;Functional mobility training;Therapeutic activities;Therapeutic exercise;Moist Heat;Patient/family education;Passive range of motion;Dry needling   ? Consulted and Agree with Plan of Care Patient   ? ?  ?  ? ?  ? ? ?Patient will benefit from skilled therapeutic intervention in order to improve the following deficits and impairments:  Impaired UE functional use, Pain, Improper body mechanics, Decreased strength, Postural dysfunction ? ?Visit Diagnosis: ?Abnormal posture ? ?Muscle weakness (generalized) ? ?Acute pain of right shoulder ? ? ? ? ?Problem List ?Patient Active Problem List  ? Diagnosis Date Noted  ? Chronic hypertension in pregnancy 01/17/2017  ? ? ?01/19/2017, DPT ?06/07/2021, 10:15 AM ? ?[place] [facility] ?[address] ?[place], [place], [place] ?Phone: [phone]   Fax:  [phone] ? ?Name: Sarah Brewer ?MRN: [name] ?Date of Birth: 06-06-1990 ? ? ? ?

## 2021-06-17 ENCOUNTER — Encounter: Payer: Self-pay | Admitting: Physical Therapy

## 2021-06-17 ENCOUNTER — Ambulatory Visit: Payer: Medicaid Other | Admitting: Physical Therapy

## 2021-06-17 DIAGNOSIS — M6281 Muscle weakness (generalized): Secondary | ICD-10-CM

## 2021-06-17 DIAGNOSIS — M25511 Pain in right shoulder: Secondary | ICD-10-CM

## 2021-06-17 DIAGNOSIS — R293 Abnormal posture: Secondary | ICD-10-CM | POA: Diagnosis not present

## 2021-06-17 NOTE — Therapy (Signed)
[facility] [address] [place], [place], [address] Phone: [phone]   Fax:  [phone]  Physical Therapy Treatment  Patient Details  Name: Sarah Brewer MRN: [id number] Date of Birth: [birth year] Referring Provider (PT): Althea Charon   Encounter Date: 06/17/2021   PT End of Session - 06/17/21 1058     Visit Number 7    Date for PT Re-Evaluation 07/12/21    PT Start Time 1020    PT Stop Time 1058    PT Time Calculation (min) 38 min    Activity Tolerance Patient tolerated treatment well    Behavior During Therapy [facility] for tasks assessed/performed             Past Medical History:  Diagnosis Date   Anemia    Hypertension     Past Surgical History:  Procedure Laterality Date   INDUCED ABORTION     WISDOM TOOTH EXTRACTION      There were no vitals filed for this visit.   Subjective Assessment - 06/17/21 1022     Subjective Shoulder is OK, don't really have pain when I come to therapy, it still hurts at work but I can do a little more with it. Haven't tried any movements on my own that make it feel like its going to come out.    Patient Stated Goals Improve shoulder stability so that it does not feel like it will dislocate, decreased pain after using it.    Currently in Pain? No/denies                               [facility] Adult PT Treatment/Exercise - 06/17/21 0001       Shoulder Exercises: Seated   Other Seated Exercises UBE LE3.5 3 min forward/3 min backward      Shoulder Exercises: Standing   Other Standing Exercises OH press yellow weighted ball x10; OH pulse presses yellow weighted ball 1x15      Shoulder Exercises: Power Radiation protection practitioner 15 reps    Extension Limitations 10#    Retraction 15 reps    Retraction Limitations 15#    External Rotation 10 reps    External Rotation Limitations 10#   B   ABduction 10 reps    ABduction Limitations 5#   B   Other Power Tower Exercises sword  draws 5# 1x10      Shoulder Exercises: Body Blade   Flexion 2 reps    Flexion Limitations horizontal x10, vertical x10    ABduction 2 reps    ABduction Limitations horizontal x10, vertical x10                     PT Education - 06/17/21 1058     Education Details exercise form    Person(s) Educated Patient    Methods Explanation    Comprehension Verbalized understanding              PT Short Term Goals - 05/26/21 0903       PT SHORT TERM GOAL #1   Title I with initial HEP.    Time 4    Period Weeks    Status Achieved               PT Long Term Goals - 06/07/21 5009       PT LONG TERM GOAL #1   Title I with final HEP  Time 7    Period Weeks    Status On-going      PT LONG TERM GOAL #2   Title Patient will report shoulder pain of </=3/10 after a shift at work.    Baseline Up to 6/10    Time 1    Period Weeks    Status On-going      PT LONG TERM GOAL #3   Title Patient will be able to lift at least 5# overhead without fear of R shoulder disllocation.    Status Achieved      PT LONG TERM GOAL #4   Title Patient will demosntrate imporved upriht posture with reduced forward head and reduced posioitn of rounded shoulders to demosntrate improved scapular position and support.    Time 7    Period Weeks    Status On-going                   Plan - 06/17/21 1058     Clinical Impression Statement Armanie arrives today doing OK, still having some issues with shoulder pain at work but this is slowly getting better. Continued with focus on functional strength today, started with work on pulleys to address shoulder musculature in general then practiced more overhead strength and motor control today as well. Will continue efforts.    Personal Factors and Comorbidities Fitness;Past/Current Experience    Examination-Activity Limitations Reach Overhead;Carry;Lift    Examination-Participation Restrictions Occupation    Stability/Clinical Decision  Making Stable/Uncomplicated    Clinical Decision Making Moderate    Rehab Potential Good    PT Frequency 1x / week    PT Duration Other (comment)    PT Treatment/Interventions ADLs/Self Care Home Management;Iontophoresis 4mg /ml Dexamethasone;Neuromuscular re-education;Manual techniques;Functional mobility training;Therapeutic activities;Therapeutic exercise;Moist Heat;Patient/family education;Passive range of motion;Dry needling    PT Next Visit Plan progress as tolerated, needs reassess soon    Consulted and Agree with Plan of Care Patient             Patient will benefit from skilled therapeutic intervention in order to improve the following deficits and impairments:  Impaired UE functional use, Pain, Improper body mechanics, Decreased strength, Postural dysfunction  Visit Diagnosis: Abnormal posture  Muscle weakness (generalized)  Acute pain of right shoulder     Problem List Patient Active Problem List   Diagnosis Date Noted   Chronic hypertension in pregnancy 01/17/2017   01/19/2017 PT, DPT, PN2   Supplemental Physical Therapist [facility]       [facility] [address] [place], [place], [place] Phone: [phone]   Fax:  [phone]  Name: Peja Allender MRN: [name] Date of Birth: [birth year]

## 2021-06-24 ENCOUNTER — Encounter: Payer: Self-pay | Admitting: Physical Therapy

## 2021-06-24 ENCOUNTER — Ambulatory Visit: Payer: Medicaid Other | Admitting: Physical Therapy

## 2021-06-24 DIAGNOSIS — R293 Abnormal posture: Secondary | ICD-10-CM | POA: Diagnosis not present

## 2021-06-24 DIAGNOSIS — M6281 Muscle weakness (generalized): Secondary | ICD-10-CM

## 2021-06-24 DIAGNOSIS — M25511 Pain in right shoulder: Secondary | ICD-10-CM

## 2021-06-24 NOTE — Patient Instructions (Signed)
Access Code: [email or web address] URL: [email or web address] Date: 06/24/2021 Prepared by: Oley Balm  Exercises - Shoulder Extension with Resistance  - 1 x daily - 7 x weekly - 2 sets - 10 reps - Standing Row with Anchored Resistance  - 1 x daily - 7 x weekly - 2 sets - 10 reps - Shoulder External Rotation and Scapular Retraction with Resistance  - 1 x daily - 7 x weekly - 2 sets - 10 reps - Doorway Pec Stretch at 90 Degrees Abduction  - 1 x daily - 7 x weekly - 3 reps - 10 hold - Doorway Pec Stretch at 120 Degrees Abduction  - 1 x daily - 7 x weekly - 3 reps - 10 hold - Doorway Pec Stretch at 60 Elevation  - 1 x daily - 7 x weekly - 3 reps - 10 hold - Shoulder Flexion Serratus Activation with Resistance  - 1 x daily - 7 x weekly - 2 sets - 10 reps - Standing Wall Ball Circles in Scaption with Mini Swiss Ball  - 1 x daily - 7 x weekly - 2 sets - 10 reps - Standing Wall [employer] with [employer]  - 1 x daily - 7 x weekly - 2 sets - 10 reps - Prone Scapular Retraction Arms at Side  - 1 x daily - 7 x weekly - 2 sets - 10 reps - Prone W Scapular Retraction  - 1 x daily - 7 x weekly - 2 sets - 10 reps - Prone Shoulder Flexion  - 1 x daily - 7 x weekly - 2 sets - 10 reps - wall taps with theraband resistance.  - 1 x daily - 7 x weekly - 3 sets - 10 reps - wall taps with theraband resistance.  - 1 x daily - 7 x weekly - 3 sets - 10 reps

## 2021-06-24 NOTE — Therapy (Signed)
[place]. [place], [place], [address] Phone: [phone]   Fax:  [phone]  Physical Therapy Treatment  Patient Details  Name: Sarah Brewer MRN: [id number] Date of Birth: January 22, 1991 Referring Provider (PT): Rip Harbour   Encounter Date: 06/24/2021   PT End of Session - 06/24/21 1108     Visit Number 8    Date for PT Re-Evaluation 07/12/21    PT Start Time 1016    PT Stop Time 1058    PT Time Calculation (min) 42 min    Activity Tolerance Patient tolerated treatment well    Behavior During Therapy [facility] for tasks assessed/performed             Past Medical History:  Diagnosis Date   Anemia    Hypertension     Past Surgical History:  Procedure Laterality Date   INDUCED ABORTION     WISDOM TOOTH EXTRACTION      There were no vitals filed for this visit.   Subjective Assessment - 06/24/21 1023     Subjective Pateint reports that her shoulder is starting to feel more stable, but she is still having pain after work. She feels a sharp pain in lower medial shoulder blade. She feels like she has to sleep to resolve it, but her back massager helps, takes hours to decrease.                               [facility] Adult PT Treatment/Exercise - 06/24/21 0001       Shoulder Exercises: Standing   Protraction Strengthening;Both;20 reps    Protraction Limitations Standing wall push ups iwth shoulder protraction and then bent over R shoulder protraction with hand on chair 2 x 10 reps.    Other Standing Exercises Theraband resisted wall taps 2 x 10 sets of 3 taps with each arm.    Other Standing Exercises OH press with blue ball, 10 reps and 10 pulse                     PT Education - 06/24/21 1108     Education Details HEP updated to include more serratus strengthening.    Person(s) Educated Patient    Methods Explanation;Demonstration;Handout    Comprehension Returned  demonstration;Verbalized understanding              PT Short Term Goals - 05/26/21 0903       PT SHORT TERM GOAL #1   Title I with initial HEP.    Time 4    Period Weeks    Status Achieved               PT Long Term Goals - 06/24/21 1111       PT LONG TERM GOAL #1   Title I with final HEP    Baseline Program updated to include more serratus work.    Time 5    Period Weeks    Status On-going      PT LONG TERM GOAL #2   Title Patient will report shoulder pain of </=3/10 after a shift at work.    Baseline Up to 6/10    Time 1    Period Weeks    Status On-going      PT LONG TERM GOAL #3   Title Patient will be able to lift at least 5# overhead without fear of R shoulder disllocation.  Status Achieved      PT LONG TERM GOAL #4   Title Patient will demosntrate imporved upriht posture with reduced forward head and reduced posioitn of rounded shoulders to demosntrate improved scapular position and support.    Time 7    Period Weeks    Status On-going                   Plan - 06/24/21 1028     Clinical Impression Statement Patient reports she feels that she is getting stronger, byut appears frustrated that she still has pain after working such that she feels the need to sleep. RE-assessment confirms medial scapular border pain on R, no radiating, no rest pain, improved iwth her massager, and she still can tell the R shoulder is weaker than L. Updated HEP to include more serratus strengthening and educated the patient to continue with HEP to improve the scapular stability.    Personal Factors and Comorbidities Fitness;Past/Current Experience    Examination-Activity Limitations Reach Overhead;Carry;Lift    Examination-Participation Restrictions Occupation    Stability/Clinical Decision Making Stable/Uncomplicated    Clinical Decision Making Moderate    Rehab Potential Good    PT Frequency 1x / week    PT Duration Other (comment)    PT  Treatment/Interventions ADLs/Self Care Home Management;Iontophoresis 4mg /ml Dexamethasone;Neuromuscular re-education;Manual techniques;Functional mobility training;Therapeutic activities;Therapeutic exercise;Moist Heat;Patient/family education;Passive range of motion;Dry needling    PT Next Visit Plan progress as tolerated, needs reassess soon    PT Home Exercise Plan Access Code: [email or web address]    Consulted and Agree with Plan of Care Patient             Patient will benefit from skilled therapeutic intervention in order to improve the following deficits and impairments:  Impaired UE functional use, Pain, Improper body mechanics, Decreased strength, Postural dysfunction  Visit Diagnosis: Abnormal posture  Muscle weakness (generalized)  Acute pain of right shoulder     Problem List Patient Active Problem List   Diagnosis Date Noted   Chronic hypertension in pregnancy 01/17/2017    Marcelina Morel, DPT 06/24/2021, 11:13 AM  [place] [place], [place], [address] Phone: [phone]   Fax:  [phone]  Name: Sarah Brewer MRN: [id number] Date of Birth: 1990-12-13

## 2021-07-01 ENCOUNTER — Encounter: Payer: Self-pay | Admitting: Physical Therapy

## 2021-07-01 ENCOUNTER — Ambulatory Visit: Payer: Medicaid Other | Attending: Family Medicine | Admitting: Physical Therapy

## 2021-07-01 DIAGNOSIS — R293 Abnormal posture: Secondary | ICD-10-CM | POA: Diagnosis present

## 2021-07-01 DIAGNOSIS — M6281 Muscle weakness (generalized): Secondary | ICD-10-CM | POA: Insufficient documentation

## 2021-07-01 DIAGNOSIS — M25511 Pain in right shoulder: Secondary | ICD-10-CM | POA: Diagnosis present

## 2021-07-01 NOTE — Therapy (Signed)
[facility] [address]. [place], [place], [address] Phone: [phone]   Fax:  [phone]  Physical Therapy Treatment  Patient Details  Name: Sarah Brewer MRN: [id number] Date of Birth: 1990-09-14 Referring Provider (PT): Althea Charon   Encounter Date: 07/01/2021   PT End of Session - 07/01/21 1045     Visit Number 9    Date for PT Re-Evaluation 07/12/21    PT Start Time 1015    PT Stop Time 1058    PT Time Calculation (min) 43 min    Activity Tolerance Patient tolerated treatment well    Behavior During Therapy [name] for tasks assessed/performed             Past Medical History:  Diagnosis Date   Anemia    Hypertension     Past Surgical History:  Procedure Laterality Date   INDUCED ABORTION     WISDOM TOOTH EXTRACTION      There were no vitals filed for this visit.   Subjective Assessment - 07/01/21 1018     Subjective Patient reports she feels about the same. She is still painful after work.    Limitations Lifting    How long can you sit comfortably? N/A    How long can you stand comfortably? N/A    How long can you walk comfortably? N/A    Patient Stated Goals Improve shoulder stability so that it does not feel like it will dislocate, decreased pain after using it.    Currently in Pain? No/denies                               [facility] Adult PT Treatment/Exercise - 07/01/21 0001       Shoulder Exercises: Prone   Extension Strengthening;Both;20 reps;Weights    Extension Weight (lbs) 3    External Rotation Strengthening;Both;20 reps    External Rotation Weight (lbs) 2    Internal Rotation Limitations During IR, patient experienced repeated pop in shoulder, so further IE deferred.    Horizontal ABduction 1 Strengthening;Both;20 reps    Horizontal ABduction 1 Weight (lbs) 2                       PT Short Term Goals - 05/26/21 4970       PT SHORT TERM GOAL #1   Title I  with initial HEP.    Time 4    Period Weeks    Status Achieved               PT Long Term Goals - 07/01/21 1034       PT LONG TERM GOAL #1   Title I with final HEP    Baseline Continuing to update HEP    Time 4    Period Weeks    Status On-going      PT LONG TERM GOAL #2   Title Patient will report shoulder pain of </=3/10 after a shift at work.    Baseline Up to 6/10    Time 4    Period Weeks    Status On-going      PT LONG TERM GOAL #3   Title Patient will be able to lift at least 5# overhead without fear of R shoulder disllocation.    Status Achieved      PT LONG TERM GOAL #4   Title Patient will demosntrate imporved upriht posture with reduced  forward head and reduced posioitn of rounded shoulders to demosntrate improved scapular position and support.    Time 4    Period Weeks    Status On-going                   Plan - 07/01/21 1046     Clinical Impression Statement Patient reports no real change. She demosntrates more weakness in R once shoulder elevation > 90. Emphasized prone scapular strength and stability with 2-3# weights. Patient reported noted weakness in R vs L, but able to focus in on specific muscle groups. Noted pop when performing IR in prone, so deferred.    Personal Factors and Comorbidities Fitness;Past/Current Experience    Examination-Activity Limitations Reach Overhead;Carry;Lift    Examination-Participation Restrictions Occupation    Stability/Clinical Decision Making Stable/Uncomplicated    Rehab Potential Good    PT Frequency 1x / week    PT Duration Other (comment)    PT Treatment/Interventions ADLs/Self Care Home Management;Iontophoresis 4mg /ml Dexamethasone;Neuromuscular re-education;Manual techniques;Functional mobility training;Therapeutic activities;Therapeutic exercise;Moist Heat;Patient/family education;Passive range of motion;Dry needling    PT Next Visit Plan Dr appointment, see what Dr said.    PT Home Exercise Plan  Access Code: [email or web address]    Consulted and Agree with Plan of Care Patient             Patient will benefit from skilled therapeutic intervention in order to improve the following deficits and impairments:  Impaired UE functional use, Pain, Improper body mechanics, Decreased strength, Postural dysfunction  Visit Diagnosis: Abnormal posture  Muscle weakness (generalized)  Acute pain of right shoulder     Problem List Patient Active Problem List   Diagnosis Date Noted   Chronic hypertension in pregnancy 01/17/2017    01/19/2017, DPT 07/01/2021, 11:54 AM  [name] [address] [place], [place], [place] Phone: [phone]   Fax:  [phone]  Name: Sarah Brewer MRN: [name] Date of Birth: [birth year]

## 2021-07-08 ENCOUNTER — Ambulatory Visit: Payer: Medicaid Other | Admitting: Physical Therapy

## 2021-07-08 ENCOUNTER — Encounter: Payer: Self-pay | Admitting: Physical Therapy

## 2021-07-08 DIAGNOSIS — M25511 Pain in right shoulder: Secondary | ICD-10-CM

## 2021-07-08 DIAGNOSIS — R293 Abnormal posture: Secondary | ICD-10-CM | POA: Diagnosis not present

## 2021-07-08 DIAGNOSIS — M6281 Muscle weakness (generalized): Secondary | ICD-10-CM

## 2021-07-08 NOTE — Therapy (Signed)
Sarah Brewer. [place], [place], [address] Phone: [phone]   Fax:  [phone]  Physical Therapy Treatment  Patient Details  Name: Sarah Brewer MRN: [id number] Date of Birth: 1990-11-05 Referring Provider (PT): [place]  Visits from Start of Care: 10  Current functional level related to goals / functional outcomes: Improved strength and stability, but continues to have pain   Remaining deficits: Pain after work   Education / Equipment: HEP   Patient agrees to discharge. Patient goals were partially met. Patient is being discharged due to the physician's request.   Encounter Date: 07/08/2021   PT End of Session - 07/08/21 0928     Visit Number 10    Date for PT Re-Evaluation 07/12/21    PT Start Time 0846    PT Stop Time 0928    PT Time Calculation (min) 42 min    Activity Tolerance Patient tolerated treatment well    Behavior During Therapy [facility] for tasks assessed/performed             Past Medical History:  Diagnosis Date   Anemia    Hypertension     Past Surgical History:  Procedure Laterality Date   INDUCED ABORTION     WISDOM TOOTH EXTRACTION      There were no vitals filed for this visit.   Subjective Assessment - 07/08/21 0852     Subjective Patient reports that she saw the Dr who prescribed an anti inflammatory nad possibly a cortizone shot due to her lack of improvement with the pain.    Limitations Lifting    How long can you sit comfortably? N/A    How long can you stand comfortably? N/A    How long can you walk comfortably? N/A    Patient Stated Goals Improve shoulder stability so that it does not feel like it will dislocate, decreased pain after using it.    Currently in Pain? No/denies                               [facility] Adult PT Treatment/Exercise - 07/08/21 0001       Shoulder Exercises: Standing   Other Standing  Exercises Updated HEP and patient educated and practiced additional overhead shoulder strengthening exercises. See HEP for exercises, 1-2 x 10 reps of each.      Shoulder Exercises: ROM/Strengthening   UBE (Upper Arm Bike) L1.5 x 3 min each direction.                     PT Education - 07/08/21 0928     Education Details HEP    Person(s) Educated Patient    Methods Explanation;Demonstration;Handout    Comprehension Verbalized understanding;Returned demonstration              PT Short Term Goals - 05/26/21 0903       PT SHORT TERM GOAL #1   Title I with initial HEP.    Time 4    Period Weeks    Status Achieved               PT Long Term Goals - 07/08/21 0919       PT LONG TERM GOAL #1   Title I with final HEP    Time 4    Period Weeks    Status Achieved      PT LONG  TERM GOAL #2   Title Patient will report shoulder pain of </=3/10 after a shift at work.    Baseline Up to 6/10    Time 4    Period Weeks    Status Not Met      PT LONG TERM GOAL #3   Title Patient will be able to lift at least 5# overhead without fear of R shoulder disllocation.    Status Achieved      PT LONG TERM GOAL #4   Title Patient will demosntrate imporved upriht posture with reduced forward head and reduced posioitn of rounded shoulders to demosntrate improved scapular position and support.    Time 4    Period Weeks    Status Achieved                   Plan - 07/08/21 4239     Clinical Impression Statement Patient returns from Dr appointment. He recommended trying some anti inflammatories since she continues to have pain wiht work activities. Recommended D/C PT, with continued HEP.    Personal Factors and Comorbidities Fitness;Past/Current Experience    Examination-Activity Limitations Reach Overhead;Carry;Lift    Examination-Participation Restrictions Occupation    Stability/Clinical Decision Making Stable/Uncomplicated    Clinical Decision Making  Moderate    Rehab Potential Good    PT Frequency 1x / week    PT Duration Other (comment)    PT Treatment/Interventions ADLs/Self Care Home Management;Iontophoresis 23m/ml Dexamethasone;Neuromuscular re-education;Manual techniques;Functional mobility training;Therapeutic activities;Therapeutic exercise;Moist Heat;Patient/family education;Passive range of motion;Dry needling    PT Next Visit Plan Dr appointment, see what Dr said.    PT Home Exercise Plan Access Code: [email or web address]    Consulted and Agree with Plan of Care Patient             Patient will benefit from skilled therapeutic intervention in order to improve the following deficits and impairments:  Impaired UE functional use, Pain, Improper body mechanics, Decreased strength, Postural dysfunction  Visit Diagnosis: Abnormal posture  Muscle weakness (generalized)  Acute pain of right shoulder     Problem List Patient Active Problem List   Diagnosis Date Noted   Chronic hypertension in pregnancy 01/17/2017    SMarcelina Morel DPT 07/08/2021, 9:31 AM  [place] [address]Phone: [phone]  Fax:  [phone] Name: NElberta LachapelleMRN: [id number] of Birth: [birth year]

## 2021-07-08 NOTE — Patient Instructions (Signed)
Access Code: [email or web address] URL: [email or web address] Date: 07/08/2021 Prepared by: Oley Balm  Exercises - Shoulder Extension with Resistance  - 1 x daily - 7 x weekly - 2 sets - 10 reps - Standing Row with Anchored Resistance  - 1 x daily - 7 x weekly - 2 sets - 10 reps - Shoulder External Rotation and Scapular Retraction with Resistance  - 1 x daily - 7 x weekly - 2 sets - 10 reps - Doorway Pec Stretch at 90 Degrees Abduction  - 1 x daily - 7 x weekly - 3 reps - 10 hold - Doorway Pec Stretch at 120 Degrees Abduction  - 1 x daily - 7 x weekly - 3 reps - 10 hold - Doorway Pec Stretch at 60 Elevation  - 1 x daily - 7 x weekly - 3 reps - 10 hold - Shoulder Flexion Serratus Activation with Resistance  - 1 x daily - 7 x weekly - 2 sets - 10 reps - Standing Wall Ball Circles in Scaption with Mini Swiss Ball  - 1 x daily - 7 x weekly - 2 sets - 10 reps - Standing Wall [employer] with [employer]  - 1 x daily - 7 x weekly - 2 sets - 10 reps - Prone Scapular Retraction Arms at Side  - 1 x daily - 7 x weekly - 2 sets - 10 reps - Prone W Scapular Retraction  - 1 x daily - 7 x weekly - 2 sets - 10 reps - Prone Shoulder Flexion  - 1 x daily - 7 x weekly - 2 sets - 10 reps - wall taps with theraband resistance.  - 1 x daily - 7 x weekly - 3 sets - 10 reps - Prone Shoulder Extension  - 1 x daily - 7 x weekly - 2 sets - 10 reps - Prone Shoulder External Rotation  - 1 x daily - 7 x weekly - 2 sets - 10 reps - Prone Scapular Retraction Y  - 1 x daily - 7 x weekly - 2 sets - 10 reps - Shoulder Overhead Press in Flexion with Dumbbells  - 1 x daily - 7 x weekly - 3 sets - 10 reps - Shoulder External Rotation in Abduction with Anchored Resistance  - 1 x daily - 7 x weekly - 3 sets - 10 reps

## 2021-07-12 ENCOUNTER — Ambulatory Visit: Payer: Medicaid Other | Admitting: Physical Therapy

## 2021-07-19 ENCOUNTER — Ambulatory Visit: Payer: Medicaid Other | Admitting: Physical Therapy

## 2021-07-26 ENCOUNTER — Ambulatory Visit: Payer: Medicaid Other | Admitting: Physical Therapy

## 2023-01-27 IMAGING — DX DG CHEST 1V PORT
1 series · 1 of 1 positions shown · non-contrast
Comparison: None.

CLINICAL DATA: High blood pressure and chest pain

EXAM:
PORTABLE CHEST 1 VIEW

[chest]
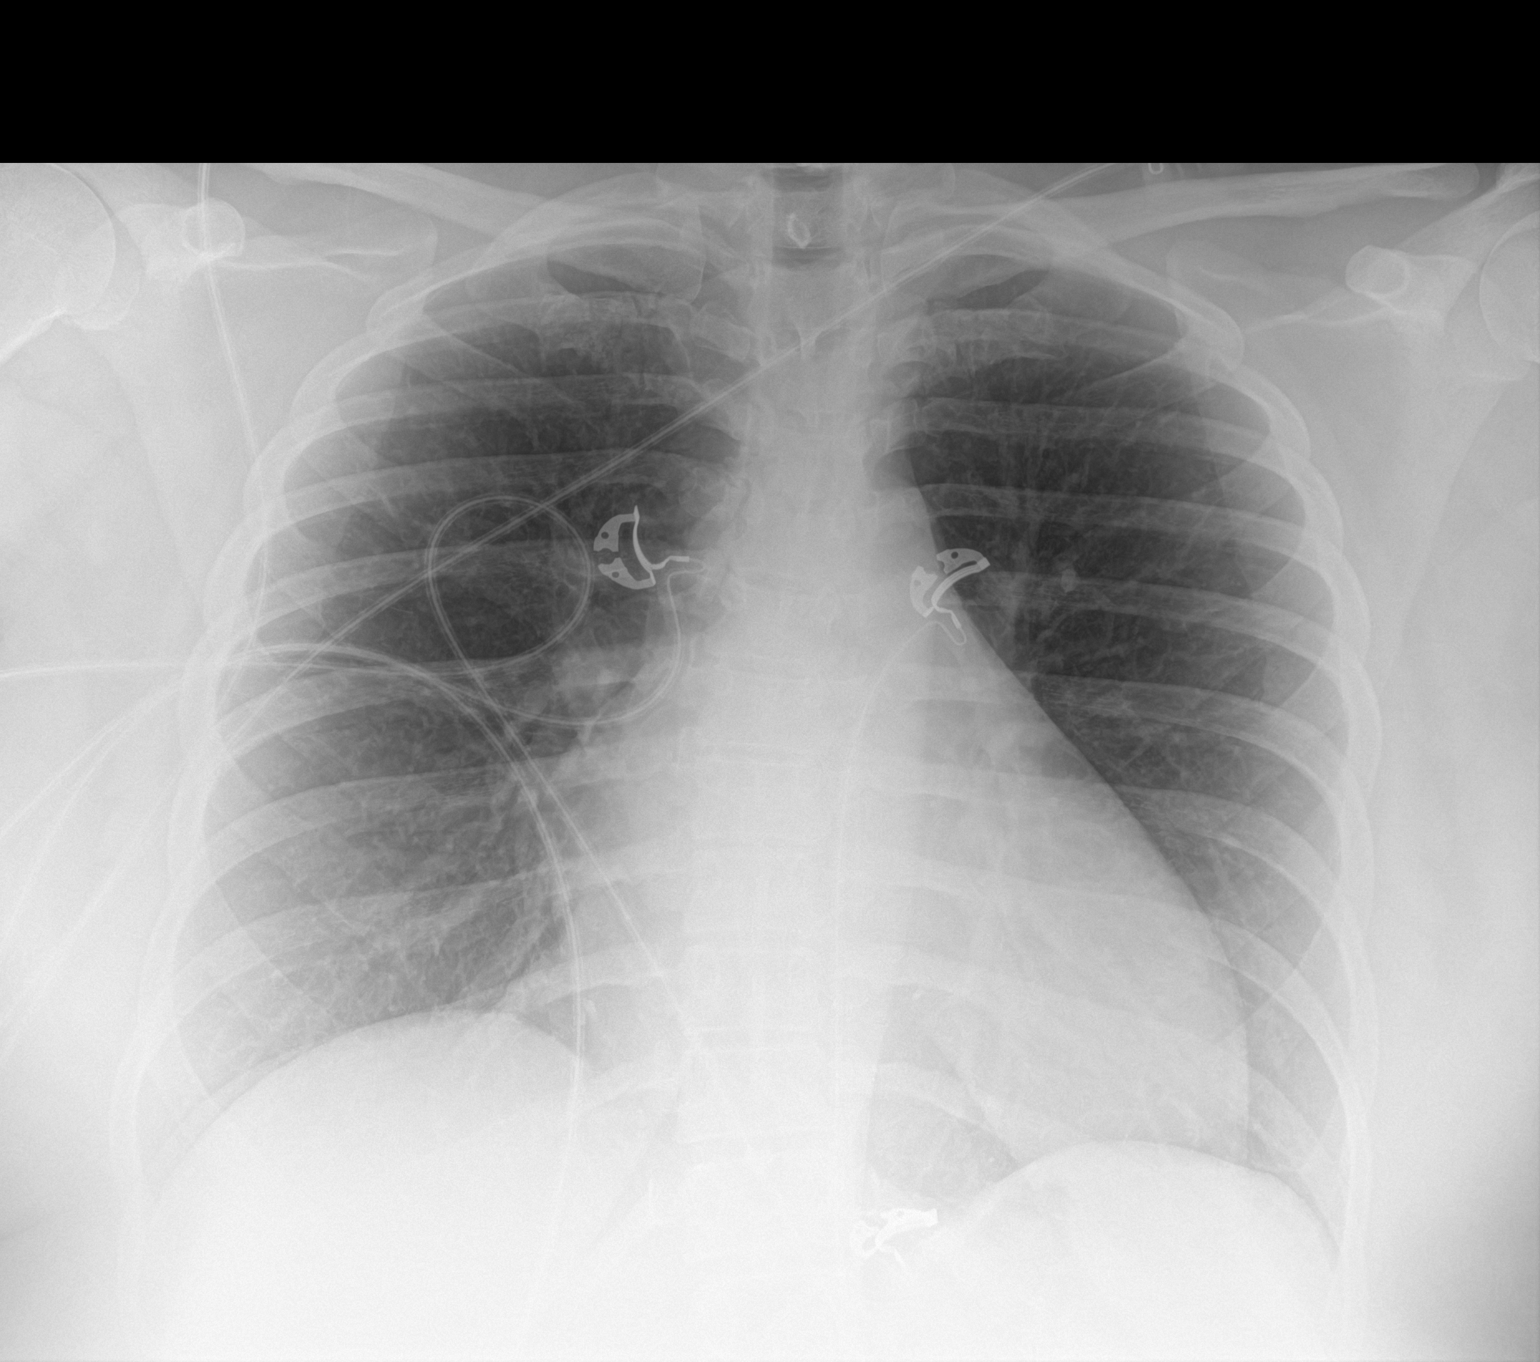

[1 of 1 positions shown; findings below may reference images not displayed]

FINDINGS: Borderline heart size but accentuated by portable technique. There
is no edema, consolidation, effusion, or pneumothorax. No osseous
findings. Artifact from EKG leads.
IMPRESSION: 1. Generous heart size.
2. Negative for edema.

## 2023-06-07 NOTE — Progress Notes (Unsigned)
 [facility] [address] [place] [address] Dept: [phone] Dept Fax: [phone]  New Patient Office Visit  Subjective:   Sarah Brewer [birth year] 06/08/2023  Chief Complaint  Patient presents with   Establish Care    Referral to Ob/gyn   GI Problem   Medication Refill    HPI: Sarah Brewer presents today to establish care at [facility]. Introduced to Publishing rights manager role and practice setting.  All questions answered.  Concerns: See below   Discussed the use of AI scribe software for clinical note transcription with the patient, who gave verbal consent to proceed.  History of Present Illness   Sarah Brewer is a 33 year old female with hypertension who presents to establish care and discuss gastrointestinal symptoms.  She has a history of hypertension, currently managed with lisinopril-hydrochlorothiazide 20/25 mg once daily. She previously took nifedipine  during pregnancy but is no longer on it. Additionally, she takes atorvastatin 20 mg once daily for elevated LDL cholesterol.  Her gastrointestinal symptoms began after contracting a stomach virus from her daughter around the end of October, initially causing explosive diarrhea and rectal bleeding. Since then, she has experienced ongoing issues with her gastrointestinal tract feeling 'completely out of whack.'  Certain foods, particularly fermented or vinegary items and leafy greens, trigger her symptoms, leading to diarrhea and upper stomach pain. The pain is primarily in the upper stomach and sometimes accompanied by audible sounds. She has not tried over-the-counter medications like Tums or Pepto-Bismol but uses ginger root and tea, which help settle her stomach. Her stools are generally well-formed unless she consumes triggering foods, which make them mushy.  She has a history of hemorrhoids from childbirth and has experienced rectal  bleeding during episodes of diarrhea. Increasing her fiber intake seems to help alleviate some symptoms.  She has not seen a doctor in over six months due to losing her insurance last year, which led to a gap in medical care. Previously, she had regular check-ups for her blood pressure and cholesterol.  She is concerned about the potential impact of atorvastatin on her blood sugar levels, although she has not had high sugar levels in the past.         The following portions of the patient's history were reviewed and updated as appropriate: past medical history, past surgical history, family history, social history, allergies, medications, and problem list.   Patient Active Problem List   Diagnosis Date Noted   IUD (intrauterine device) in place 06/08/2023   Primary hypertension 06/08/2023   Hyperlipidemia 06/08/2023   Chronic hypertension in pregnancy 01/17/2017   Past Medical History:  Diagnosis Date   Hypertension    Obesity    Past Surgical History:  Procedure Laterality Date   INDUCED ABORTION     WISDOM TOOTH EXTRACTION     Family History  Problem Relation Age of Onset   Lung cancer Maternal Grandmother    Breast cancer Maternal Grandmother    Heart disease Paternal Grandmother    Kidney disease Paternal Grandmother    Heart disease Paternal Grandfather     Current Outpatient Medications:    omeprazole (PRILOSEC) 20 MG capsule, Take 1 capsule (20 mg total) by mouth daily., Disp: 30 capsule, Rfl: 3   atorvastatin (LIPITOR) 20 MG tablet, Take 1 tablet (20 mg total) by mouth daily., Disp: 90 tablet, Rfl: 1   lisinopril-hydrochlorothiazide (ZESTORETIC) 20-25 MG tablet, Take 1 tablet by mouth daily., Disp: 90 tablet, Rfl:  1 Allergies  Allergen Reactions   Penicillins Rash    Has patient had a PCN reaction causing immediate rash, facial/tongue/throat swelling, SOB or lightheadedness with hypotension: Yes Has patient had a PCN reaction causing severe rash involving mucus  membranes or skin necrosis: Yes Has patient had a PCN reaction that required hospitalization: No Has patient had a PCN reaction occurring within the last 10 years: No If all of the above answers are "NO", then may proceed with Cephalosporin use.    ROS: A complete ROS was performed with pertinent positives/negatives noted in the HPI. The remainder of the ROS are negative.   Objective:   Today's Vitals   06/08/23 1000  BP: (!) 146/82  Pulse: 100  Temp: 97.6 F (36.4 C)  TempSrc: Temporal  SpO2: 99%  Weight: 282 lb 9.6 oz (128.2 kg)  Height: 5\' 9"  (1.753 m)    GENERAL: Well-appearing, in NAD. Well nourished.  SKIN: Pink, warm and dry. No rash, lesion, ulceration, or ecchymoses.  NECK: Trachea midline. Full ROM w/o pain or tenderness. No lymphadenopathy.  RESPIRATORY: Chest wall symmetrical. Respirations even and non-labored. Breath sounds clear to auscultation bilaterally.  CARDIAC: S1, S2 present, regular rate and rhythm. Peripheral pulses 2+ bilaterally.  EXTREMITIES: Without clubbing, cyanosis, or edema.  NEUROLOGIC: No motor or sensory deficits. Steady, even gait.  PSYCH/MENTAL STATUS: Alert, oriented x 3. Cooperative, appropriate mood and affect.   Health Maintenance Due  Topic Date Due   Hepatitis C Screening  Never done   HPV VACCINES (3 - 3-dose series) 10/12/2009   DTaP/Tdap/Td (7 - Td or Tdap) 11/26/2016   Cervical Cancer Screening (HPV/Pap Cotest)  Never done    No results found for any visits on 06/08/23.  Assessment & Plan:  Assessment and Plan    Gastrointestinal symptoms post-viral infection Persistent gastrointestinal symptoms post-viral infection suggest gastritis or H. pylori infection. Differential includes acid reflux and gastritis. Discussed viral irritation and food triggers. - Order H. pylori breath test. - Prescribe omeprazole 20 mg twice daily for 6 weeks. - Advise avoidance of acidic, vinegary, greasy, and spicy foods. - Follow up in 6 weeks to  reassess symptoms and treatment response.  Hemorrhoids External hemorrhoids with occasional rectal bleeding, exacerbated by diarrhea. Increased fiber intake has reduced symptoms. - Advise use of Tux pads for symptomatic relief. - Discuss potential use of suppositories or creams if symptoms worsen.  Hypertension Hypertension managed with lisinopril-hydrochlorothiazide. Home readings generally well-controlled, elevated due to stress during visit. - Send refill for lisinopril-hydrochlorothiazide. - Check blood pressure during the visit.  Hyperlipidemia Hyperlipidemia managed with atorvastatin. Clarified atorvastatin does not cause diabetes but reduces cardiovascular risk. - Send refill for atorvastatin. - Order lipid panel to check cholesterol levels. - Order A1c to screen for diabetes.  Wellness Visit Routine wellness visit to establish care. Discussed cervical cancer screening and IUD management. Pap smears recommended every three years if previous results were normal. - Place referral to OBGYN for routine cervical cancer screening and IUD management.       Orders Placed This Encounter  Procedures   Comprehensive metabolic panel with GFR   TSH   Lipid panel   Hemoglobin A1C   H. pylori breath test   Ambulatory referral to Obstetrics / Gynecology    Referral Priority:   Routine    Referral Type:   Consultation    Referral Reason:   Specialty Services Required    Requested Specialty:   Obstetrics and Gynecology    Number of Visits  Requested:   1   Meds ordered this encounter  Medications   atorvastatin (LIPITOR) 20 MG tablet    Sig: Take 1 tablet (20 mg total) by mouth daily.    Dispense:  90 tablet    Refill:  1    Supervising Provider:   THOMPSON, AARON B [[id number]]   lisinopril-hydrochlorothiazide (ZESTORETIC) 20-25 MG tablet    Sig: Take 1 tablet by mouth daily.    Dispense:  90 tablet    Refill:  1    Supervising Provider:   THOMPSON, AARON B [[id number]]   omeprazole  (PRILOSEC) 20 MG capsule    Sig: Take 1 capsule (20 mg total) by mouth daily.    Dispense:  30 capsule    Refill:  3    Supervising Provider:   Catheryn Cluck [[id number]]    Return in about 6 weeks (around 07/20/2023) for abdominal discomfort , diarrhea.   Gavin Kast, FNP

## 2023-06-08 ENCOUNTER — Encounter: Payer: Self-pay | Admitting: Internal Medicine

## 2023-06-08 ENCOUNTER — Ambulatory Visit: Payer: Self-pay | Admitting: Internal Medicine

## 2023-06-08 VITALS — BP 146/82 | HR 100 | Temp 97.6°F | Ht 69.0 in | Wt 282.6 lb

## 2023-06-08 DIAGNOSIS — Z6841 Body Mass Index (BMI) 40.0 and over, adult: Secondary | ICD-10-CM

## 2023-06-08 DIAGNOSIS — K649 Unspecified hemorrhoids: Secondary | ICD-10-CM

## 2023-06-08 DIAGNOSIS — E66813 Obesity, class 3: Secondary | ICD-10-CM | POA: Diagnosis not present

## 2023-06-08 DIAGNOSIS — E785 Hyperlipidemia, unspecified: Secondary | ICD-10-CM | POA: Insufficient documentation

## 2023-06-08 DIAGNOSIS — Z124 Encounter for screening for malignant neoplasm of cervix: Secondary | ICD-10-CM

## 2023-06-08 DIAGNOSIS — R197 Diarrhea, unspecified: Secondary | ICD-10-CM | POA: Diagnosis not present

## 2023-06-08 DIAGNOSIS — R1013 Epigastric pain: Secondary | ICD-10-CM | POA: Diagnosis not present

## 2023-06-08 DIAGNOSIS — I10 Essential (primary) hypertension: Secondary | ICD-10-CM | POA: Insufficient documentation

## 2023-06-08 DIAGNOSIS — Z975 Presence of (intrauterine) contraceptive device: Secondary | ICD-10-CM | POA: Insufficient documentation

## 2023-06-08 LAB — LIPID PANEL
Cholesterol: 150 mg/dL (ref 0–200)
HDL: 36.3 mg/dL — ABNORMAL LOW (ref >39.00)
LDL Cholesterol: 83 mg/dL (ref 0–99)
NonHDL: 113.21
Total CHOL/HDL Ratio: 4
Triglycerides: 152 mg/dL — ABNORMAL HIGH (ref 0.0–149.0)
VLDL: 30.4 mg/dL (ref 0.0–40.0)

## 2023-06-08 LAB — TSH: TSH: 1 u[IU]/mL (ref 0.35–5.50)

## 2023-06-08 LAB — COMPREHENSIVE METABOLIC PANEL WITH GFR
ALT: 26 U/L (ref 0–35)
AST: 42 U/L — ABNORMAL HIGH (ref 0–37)
Albumin: 4.4 g/dL (ref 3.5–5.2)
Alkaline Phosphatase: 91 U/L (ref 39–117)
BUN: 7 mg/dL (ref 6–23)
CO2: 26 meq/L (ref 19–32)
Calcium: 9.7 mg/dL (ref 8.4–10.5)
Chloride: 99 meq/L (ref 96–112)
Creatinine, Ser: 0.66 mg/dL (ref 0.40–1.20)
GFR: 116.12 mL/min (ref >60.00)
Glucose, Bld: 102 mg/dL — ABNORMAL HIGH (ref 70–99)
Potassium: 3.6 meq/L (ref 3.5–5.1)
Sodium: 136 meq/L (ref 135–145)
Total Bilirubin: 0.6 mg/dL (ref 0.2–1.2)
Total Protein: 8.1 g/dL (ref 6.0–8.3)

## 2023-06-08 LAB — HEMOGLOBIN A1C: Hgb A1c MFr Bld: 6.2 % (ref 4.6–6.5)

## 2023-06-08 MED ORDER — LISINOPRIL-HYDROCHLOROTHIAZIDE 20-25 MG PO TABS: 1.0000 | ORAL_TABLET | Freq: Every day | ORAL | 1 refills | Status: DC

## 2023-06-08 MED ORDER — OMEPRAZOLE 20 MG PO CPDR: 20.0000 mg | DELAYED_RELEASE_CAPSULE | Freq: Every day | ORAL | 3 refills | Status: DC

## 2023-06-08 MED ORDER — ATORVASTATIN CALCIUM 20 MG PO TABS: 20.0000 mg | ORAL_TABLET | Freq: Every day | ORAL | 1 refills | Status: DC

## 2023-06-10 LAB — H. PYLORI BREATH TEST: H. pylori Breath Test: NOT DETECTED

## 2023-06-12 ENCOUNTER — Encounter: Payer: Self-pay | Admitting: Internal Medicine

## 2023-06-12 ENCOUNTER — Ambulatory Visit: Payer: Self-pay | Admitting: Internal Medicine

## 2023-06-12 DIAGNOSIS — R7303 Prediabetes: Secondary | ICD-10-CM | POA: Insufficient documentation

## 2023-06-15 ENCOUNTER — Ambulatory Visit: Admitting: Internal Medicine

## 2023-07-18 ENCOUNTER — Encounter: Payer: Self-pay | Admitting: Internal Medicine

## 2023-08-10 ENCOUNTER — Ambulatory Visit: Admitting: Internal Medicine

## 2023-08-10 VITALS — BP 128/80 | HR 83 | Temp 98.0°F | Ht 69.0 in | Wt 283.8 lb

## 2023-08-10 DIAGNOSIS — R7303 Prediabetes: Secondary | ICD-10-CM

## 2023-08-10 DIAGNOSIS — R1013 Epigastric pain: Secondary | ICD-10-CM | POA: Diagnosis not present

## 2023-08-10 DIAGNOSIS — K219 Gastro-esophageal reflux disease without esophagitis: Secondary | ICD-10-CM | POA: Insufficient documentation

## 2023-08-10 DIAGNOSIS — E781 Pure hyperglyceridemia: Secondary | ICD-10-CM

## 2023-08-10 NOTE — Patient Instructions (Signed)
 Continue Omeprazole  20mg  once daily  Try natural supplement for acid reflux

## 2023-08-10 NOTE — Progress Notes (Signed)
 [facility]ARE [facility] [address] [place] [address] Dept: [phone] Dept Fax: [phone]    Subjective:   Sarah Brewer 1990-11-27 08/10/2023  Chief Complaint  Patient presents with   Follow-up    HPI: Sarah Brewer presents today for re-assessment and management of chronic medical conditions.  Discussed the use of AI scribe software for clinical note transcription with the patient, who gave verbal consent to proceed.  History of Present Illness   Sarah Brewer is a 33 year old female who presents for follow-up on epigastric pain and acid reflux.  She has experienced improvements in her epigastric pain and acid reflux symptoms since her last visit. She has been taking omeprazole  20 mg once daily and has been abstaining from certain trigger foods. She has also incorporated herbal remedies such as slippery elm and digestive enzymes into her regimen, which she feels have been beneficial.  She initially noticed her symptoms after a viral illness that involved vomiting, which she believes may have irritated her stomach lining. She continues to experience discomfort after consuming certain foods, particularly those with pickled relish or acidic components like tomatoes and lemons. She has eliminated these from her diet to manage her symptoms better.  She has also noticed a change in her taste perception, particularly with vinegary foods.  Patient would also like to discuss her recent lab results.  Did show an elevation in her triglycerides along with A1c in the prediabetes range of 6.2%.   The following portions of the patient's history were reviewed and updated as appropriate: past medical history, past surgical history, family history, social history, allergies, medications, and problem list.   Patient Active Problem List   Diagnosis Date Noted   Prediabetes 06/12/2023   IUD (intrauterine device) in place 06/08/2023   Primary  hypertension 06/08/2023   Hyperlipidemia 06/08/2023   Chronic hypertension in pregnancy 01/17/2017   Past Medical History:  Diagnosis Date   Allergy 2009   Hypertension    Obesity    Past Surgical History:  Procedure Laterality Date   INDUCED ABORTION     WISDOM TOOTH EXTRACTION     Family History  Problem Relation Age of Onset   Lung cancer Maternal Grandmother    Breast cancer Maternal Grandmother    Cancer Maternal Grandmother    Heart disease Paternal Grandmother    Kidney disease Paternal Grandmother    Cancer Paternal Grandmother    Heart disease Paternal Grandfather    Hypertension Mother    Miscarriages / Stillbirths Mother    Hypertension Father    Hypertension Sister    Intellectual disability Daughter    Learning disabilities Daughter    Stroke Paternal Aunt     Current Outpatient Medications:    atorvastatin  (LIPITOR) 20 MG tablet, Take 1 tablet (20 mg total) by mouth daily., Disp: 90 tablet, Rfl: 1   lisinopril -hydrochlorothiazide  (ZESTORETIC ) 20-25 MG tablet, Take 1 tablet by mouth daily., Disp: 90 tablet, Rfl: 1   omeprazole  (PRILOSEC) 20 MG capsule, Take 1 capsule (20 mg total) by mouth daily., Disp: 30 capsule, Rfl: 3 Allergies  Allergen Reactions   Penicillins Rash    Has patient had a PCN reaction causing immediate rash, facial/tongue/throat swelling, SOB or lightheadedness with hypotension: Yes Has patient had a PCN reaction causing severe rash involving mucus membranes or skin necrosis: Yes Has patient had a PCN reaction that required hospitalization: No Has patient had a PCN reaction occurring within the last 10 years: No If all  of the above answers are NO, then may proceed with Cephalosporin use.     ROS: A complete ROS was performed with pertinent positives/negatives noted in the HPI. The remainder of the ROS are negative.    Objective:   Today's Vitals   08/10/23 1007  BP: 128/80  Pulse: 83  Temp: 98 F (36.7 C)  TempSrc: Temporal   SpO2: 100%  Weight: 283 lb 12.8 oz (128.7 kg)  Height: 5' 9 (1.753 m)    GENERAL: Well-appearing, in NAD. Well nourished.  SKIN: Pink, warm and dry. No rash, lesion, ulceration, or ecchymoses.  NECK: Trachea midline. Full ROM w/o pain or tenderness. No lymphadenopathy.  RESPIRATORY: Chest wall symmetrical. Respirations even and non-labored. Breath sounds clear to auscultation bilaterally.  CARDIAC: S1, S2 present, regular rate and rhythm. Peripheral pulses 2+ bilaterally.  GI: Abdomen soft, non-tender. Normoactive bowel sounds. No rebound tenderness. No hepatomegaly or splenomegaly. No CVA tenderness.  EXTREMITIES: Without clubbing, cyanosis, or edema.  NEUROLOGIC: No motor or sensory deficits. Steady, even gait.  PSYCH/MENTAL STATUS: Alert, oriented x 3. Cooperative, appropriate mood and affect.   Health Maintenance Due  Topic Date Due   Hepatitis C Screening  Never done   HPV VACCINES (3 - 3-dose series) 10/12/2009   Hepatitis B Vaccines (1 of 3 - 19+ 3-dose series) Never done   DTaP/Tdap/Td (7 - Td or Tdap) 11/26/2016   Cervical Cancer Screening (HPV/Pap Cotest)  Never done    No results found for any visits on 08/10/23.  The ASCVD Risk score (Arnett DK, et al., 2019) failed to calculate for the following reasons:   The 2019 ASCVD risk score is only valid for ages 60 to 44     Assessment & Plan:  Assessment and Plan    Epigastric pain and acid reflux Symptoms improved with omeprazole  and dietary changes. Discussed potential need for long-term omeprazole  use and natural supplements for symptom management. Explained omeprazole 's mechanism and natural supplement as an alternative. - Continue omeprazole  20 mg daily. - Provide Reflux gourmet natural supplement for acid reflux to use as needed  - Continue to avoid trigger foods - Reassess symptoms in six weeks. - Consider referral to gastroenterology if symptoms do not improve.  Prediabetes A1c in prediabetes range.  Emphasized diet and exercise to manage glucose levels and prevent diabetes progression. Recommended weight loss. - Monitor diet and exercise regularly. - Encourage weight loss to help manage blood glucose levels.  Hypertriglyceridemia Triglyceride levels slightly elevated, likely dietary related. Advised dietary modifications. - Reduce intake of greasy and fatty foods.   General Health Maintenance Due for annual physical exam. - Schedule an annual physical exam.       No orders of the defined types were placed in this encounter.  No images are attached to the encounter or orders placed in the encounter. No orders of the defined types were placed in this encounter.   Return in about 8 weeks (around 10/05/2023) for epigastric discomfort, acid reflux .   Rosina Senters, FNP

## 2023-08-16 ENCOUNTER — Other Ambulatory Visit: Payer: Self-pay | Admitting: Internal Medicine

## 2023-08-16 DIAGNOSIS — R1013 Epigastric pain: Secondary | ICD-10-CM

## 2023-09-12 ENCOUNTER — Other Ambulatory Visit: Payer: Self-pay | Admitting: Internal Medicine

## 2023-09-12 DIAGNOSIS — I10 Essential (primary) hypertension: Secondary | ICD-10-CM

## 2023-10-05 ENCOUNTER — Ambulatory Visit: Admitting: Internal Medicine

## 2023-10-10 ENCOUNTER — Ambulatory Visit: Admitting: Internal Medicine

## 2023-10-10 ENCOUNTER — Encounter: Payer: Self-pay | Admitting: Internal Medicine

## 2023-10-10 VITALS — BP 128/80 | HR 91 | Temp 98.0°F | Ht 69.0 in | Wt 282.2 lb

## 2023-10-10 DIAGNOSIS — R1013 Epigastric pain: Secondary | ICD-10-CM

## 2023-10-10 DIAGNOSIS — E781 Pure hyperglyceridemia: Secondary | ICD-10-CM | POA: Diagnosis not present

## 2023-10-10 DIAGNOSIS — K219 Gastro-esophageal reflux disease without esophagitis: Secondary | ICD-10-CM | POA: Diagnosis not present

## 2023-10-10 DIAGNOSIS — E669 Obesity, unspecified: Secondary | ICD-10-CM | POA: Insufficient documentation

## 2023-10-10 DIAGNOSIS — Z6841 Body Mass Index (BMI) 40.0 and over, adult: Secondary | ICD-10-CM

## 2023-10-10 DIAGNOSIS — E66813 Obesity, class 3: Secondary | ICD-10-CM

## 2023-10-10 DIAGNOSIS — I10 Essential (primary) hypertension: Secondary | ICD-10-CM

## 2023-10-10 MED ORDER — LISINOPRIL-HYDROCHLOROTHIAZIDE 20-25 MG PO TABS: 1.0000 | ORAL_TABLET | Freq: Every day | ORAL | 3 refills | Status: AC

## 2023-10-10 NOTE — Progress Notes (Signed)
 [facility] [address] [place] [address] Dept: [phone] Dept Fax: [phone]    Subjective:   Sarah Brewer [birth year] 10/10/2023  Chief Complaint  Patient presents with   Follow-up    Pt is fating, GI issue have got better if things worse would like to see specialist, physical for next year need one refill lisinopril     HPI: Sarah Brewer presents today for re-assessment and management of chronic medical conditions.  Discussed the use of AI scribe software for clinical note transcription with the patient, who gave verbal consent to proceed.  History of Present Illness   Sarah Brewer is a 33 year old female with epigastric pain and acid reflux who presents for follow-up.  She has experienced ongoing improvement in her epigastric pain and acid reflux symptoms since her last visit. She takes omeprazole  20 mg once daily and avoids trigger foods. Additionally, she uses herbal remedies such as slippery elm and digestive enzymes, which she finds beneficial. Her symptoms have significantly calmed down, with fewer flares, typically associated with dietary indiscretions. She also tried the reflux gourmet when symptoms flared up, which did quickly resolve acid reflux.   Her past medical history includes primary hypertension, for which she takes lisinopril  hydrochlorothiazide  20-25 mg once daily. This remains well controlled.   She would like some advice on weight management and her elevated triglyceride level.   Last lipids Lab Results  Component Value Date   CHOL 150 06/08/2023   HDL 36.30 (L) 06/08/2023   LDLCALC 83 06/08/2023   TRIG 152.0 (H) 06/08/2023   CHOLHDL 4 06/08/2023   Wt Readings from Last 3 Encounters:  10/10/23 282 lb 3.2 oz (128 kg)  08/10/23 283 lb 12.8 oz (128.7 kg)  06/08/23 282 lb 9.6 oz (128.2 kg)     The following portions of the patient's history were reviewed and updated as appropriate: past  medical history, past surgical history, family history, social history, allergies, medications, and problem list.   Patient Active Problem List   Diagnosis Date Noted   Obesity, unspecified 10/10/2023   Gastroesophageal reflux disease 08/10/2023   Prediabetes 06/12/2023   IUD (intrauterine device) in place 06/08/2023   Primary hypertension 06/08/2023   Hyperlipidemia 06/08/2023   Chronic hypertension in pregnancy 01/17/2017   Past Medical History:  Diagnosis Date   Allergy 2009   Hypertension    Obesity    Past Surgical History:  Procedure Laterality Date   INDUCED ABORTION     WISDOM TOOTH EXTRACTION     Family History  Problem Relation Age of Onset   Lung cancer Maternal Grandmother    Breast cancer Maternal Grandmother    Cancer Maternal Grandmother    Heart disease Paternal Grandmother    Kidney disease Paternal Grandmother    Cancer Paternal Grandmother    Heart disease Paternal Grandfather    Hypertension Mother    Miscarriages / Stillbirths Mother    Hypertension Father    Hypertension Sister    Intellectual disability Daughter    Learning disabilities Daughter    Stroke Paternal Aunt     Current Outpatient Medications:    atorvastatin  (LIPITOR) 20 MG tablet, Take 1 tablet (20 mg total) by mouth daily., Disp: 90 tablet, Rfl: 1   omeprazole  (PRILOSEC) 20 MG capsule, TAKE 1 CAPSULE(20 MG) BY MOUTH DAILY, Disp: 90 capsule, Rfl: 1   lisinopril -hydrochlorothiazide  (ZESTORETIC ) 20-25 MG tablet, Take 1 tablet by mouth daily., Disp: 90 tablet, Rfl: 3 Allergies  Allergen  Reactions   Penicillins Rash    Has patient had a PCN reaction causing immediate rash, facial/tongue/throat swelling, SOB or lightheadedness with hypotension: Yes Has patient had a PCN reaction causing severe rash involving mucus membranes or skin necrosis: Yes Has patient had a PCN reaction that required hospitalization: No Has patient had a PCN reaction occurring within the last 10 years: No If all  of the above answers are NO, then may proceed with Cephalosporin use.     ROS: A complete ROS was performed with pertinent positives/negatives noted in the HPI. The remainder of the ROS are negative.    Objective:   Today's Vitals   10/10/23 0917  BP: 128/80  Pulse: 91  Temp: 98 F (36.7 C)  TempSrc: Temporal  SpO2: 98%  Weight: 282 lb 3.2 oz (128 kg)  Height: 5' 9 (1.753 m)    GENERAL: Well-appearing, in NAD. Well nourished.  SKIN: Pink, warm and dry.  RESPIRATORY: Chest wall symmetrical. Respirations even and non-labored. Breath sounds clear to auscultation bilaterally.  CARDIAC: S1, S2 present, regular rate and rhythm. Peripheral pulses 2+ bilaterally.  GI: Abdomen soft, non-tender. Normoactive bowel sounds. NEUROLOGIC:  Steady, even gait.  PSYCH/MENTAL STATUS: Alert, oriented x 3. Cooperative, appropriate mood and affect.   Health Maintenance Due  Topic Date Due   HPV VACCINES (3 - 3-dose series) 10/12/2009   DTaP/Tdap/Td (7 - Td or Tdap) 11/26/2016   Cervical Cancer Screening (HPV/Pap Cotest)  Never done    No results found for any visits on 10/10/23.  The ASCVD Risk score (Arnett DK, et al., 2019) failed to calculate for the following reasons:   The 2019 ASCVD risk score is only valid for ages 66 to 63     Assessment & Plan:  1. Epigastric pain (Primary) - greatly improved since starting Omeprazole  and avoiding dietary triggers   2. Primary hypertension - well controlled  - lisinopril -hydrochlorothiazide  (ZESTORETIC ) 20-25 MG tablet; Take 1 tablet by mouth daily.  Dispense: 90 tablet; Refill: 3  3. Gastroesophageal reflux disease, unspecified whether esophagitis present - Greatly improved - Can do omeprazole  20 mg every other day or just take as needed - Can continue herbal remedies and reflux Gourmet natural supplement.  4. Pure hypertriglyceridemia -Discussed healthy eating and regular exercise.  - Will recheck this at her physical in 6 months  5.  Class 3 severe obesity with serious comorbidity and body mass index (BMI) of 40.0 to 44.9 in adult, unspecified obesity type -Discussed healthy eating.  Avoid processed foods, greasy/fatty foods, foods high in preservatives.  Do regular exercise - Discussed calorie counting using free apps such as lose it or my fitness pal   No images are attached to the encounter or orders placed in the encounter. Meds ordered this encounter  Medications   lisinopril -hydrochlorothiazide  (ZESTORETIC ) 20-25 MG tablet    Sig: Take 1 tablet by mouth daily.    Dispense:  90 tablet    Refill:  3    Return in about 6 months (around 04/08/2024) for Annual Physical Exam with fasting lab work.   Rosina Senters, FNP

## 2023-10-10 NOTE — Patient Instructions (Signed)
  VISIT SUMMARY: You came in today for a follow-up visit regarding your epigastric pain and acid reflux. You have experienced significant improvement in your symptoms since your last visit, and your current management plan seems to be effective. Additionally, your blood pressure remains well-controlled.  YOUR PLAN: -GASTROESOPHAGEAL REFLUX DISEASE (GERD) WITH EPIGASTRIC PAIN: GERD is a condition where stomach acid frequently flows back into the tube connecting your mouth and stomach, causing discomfort. Your symptoms have improved with the use of omeprazole , dietary changes, and herbal remedies. Can decrease Omeprazole  20mg  to every other day, or take as needed. Avoid trigger foods, and use slippery elm and digestive enzymes. If symptoms reoccur, can re-start omeprazole  once daily. You may also consider using a wedge pillow to help with nighttime reflux. No need for a GI specialist referral unless your symptoms get worse.  -ESSENTIAL HYPERTENSION: Hypertension is high blood pressure. Your blood pressure is well-controlled with your current medication, lisinopril  hydrochlorothiazide . Continue taking this medication at 20-25 mg once daily. Your prescription has been refilled for one year with three refills.  WEIGHT MANAGEMENT: Track calories with myfitnesspal or Lose it App. Regular exercise. Avoid processed foods. Avoid greasy/fatty foods. This will help with weight loss and improve cholesterol.   INSTRUCTIONS: Please continue with your current medications and lifestyle modifications. If your symptoms worsen or you experience any new issues, schedule an appointment. No need for a GI referral unless symptoms worsen.                      Contains text generated by [employer]bridge.                                 Contains text generated by Abridge.

## 2023-12-07 ENCOUNTER — Other Ambulatory Visit: Payer: Self-pay | Admitting: Internal Medicine

## 2023-12-07 DIAGNOSIS — E785 Hyperlipidemia, unspecified: Secondary | ICD-10-CM

## 2024-02-17 ENCOUNTER — Other Ambulatory Visit: Payer: Self-pay | Admitting: Internal Medicine

## 2024-02-17 DIAGNOSIS — R1013 Epigastric pain: Secondary | ICD-10-CM

## 2024-02-18 NOTE — Telephone Encounter (Signed)
 Refill request received for  OMEPRAZOLE  20MG  CAPSULES  FOV:04/08/2024 LOV 10/10/2023 Last refill:08/16/2023 Medication is pending your approval.

## 2024-04-08 ENCOUNTER — Encounter: Admitting: Internal Medicine
